# Patient Record
Sex: Male | Born: 1975 | Race: Black or African American | Hispanic: No | Marital: Single | Smoking: Never smoker
Health system: Southern US, Community
[De-identification: ages and names within clinical notes are randomized; demographics above are authoritative.]

## PROBLEM LIST (undated history)

## (undated) DIAGNOSIS — K3533 Acute appendicitis with perforation and localized peritonitis, with abscess: Secondary | ICD-10-CM

## (undated) DIAGNOSIS — E785 Hyperlipidemia, unspecified: Secondary | ICD-10-CM

## (undated) HISTORY — DX: Hyperlipidemia, unspecified: E78.5

## (undated) HISTORY — DX: Acute appendicitis with perforation, localized peritonitis, and gangrene, with abscess: K35.33

## (undated) HISTORY — PX: APPENDECTOMY: SHX54

---

## 1999-07-01 ENCOUNTER — Emergency Department (HOSPITAL_COMMUNITY): Admission: EM | Admit: 1999-07-01 | Discharge: 1999-07-01 | Payer: Self-pay | Admitting: Emergency Medicine

## 1999-07-02 ENCOUNTER — Encounter: Payer: Self-pay | Admitting: *Deleted

## 2002-02-26 ENCOUNTER — Emergency Department (HOSPITAL_COMMUNITY): Admission: EM | Admit: 2002-02-26 | Discharge: 2002-02-26 | Payer: Self-pay

## 2006-06-13 ENCOUNTER — Emergency Department (HOSPITAL_COMMUNITY): Admission: EM | Admit: 2006-06-13 | Discharge: 2006-06-13 | Payer: Self-pay | Admitting: Emergency Medicine

## 2011-08-20 ENCOUNTER — Observation Stay (HOSPITAL_COMMUNITY)
Admission: EM | Admit: 2011-08-20 | Discharge: 2011-08-21 | Disposition: A | Discharge status: Home / Self Care | Payer: Medicare Other | Attending: Emergency Medicine | Admitting: Emergency Medicine

## 2011-08-20 ENCOUNTER — Emergency Department (HOSPITAL_COMMUNITY): Payer: Medicare Other

## 2011-08-20 DIAGNOSIS — R079 Chest pain, unspecified: Principal | ICD-10-CM | POA: Insufficient documentation

## 2011-08-20 LAB — POCT I-STAT, CHEM 8
BUN: 19 mg/dL (ref 6–23)
Calcium, Ion: 1.22 mmol/L (ref 1.12–1.32)
Chloride: 101 mEq/L (ref 96–112)
Creatinine, Ser: 1.4 mg/dL — ABNORMAL HIGH (ref 0.50–1.35)
Glucose, Bld: 141 mg/dL — ABNORMAL HIGH (ref 70–99)
HCT: 40 % (ref 39.0–52.0)
Hemoglobin: 13.6 g/dL (ref 13.0–17.0)
Potassium: 4.2 mEq/L (ref 3.5–5.1)
Sodium: 138 mEq/L (ref 135–145)
TCO2: 27 mmol/L (ref 0–100)

## 2011-08-20 LAB — POCT I-STAT TROPONIN I: Troponin i, poc: 0 ng/mL (ref 0.00–0.08)

## 2011-08-21 ENCOUNTER — Observation Stay (HOSPITAL_COMMUNITY): Payer: Medicare Other

## 2011-08-21 LAB — CK TOTAL AND CKMB (NOT AT ARMC)
CK, MB: 2 ng/mL (ref 0.3–4.0)
CK, MB: 1.7 ng/mL (ref 0.3–4.0)
Relative Index: INVALID (ref 0.0–2.5)
Relative Index: INVALID (ref 0.0–2.5)
Total CK: 82 U/L (ref 7–232)
Total CK: 77 U/L (ref 7–232)

## 2011-08-21 LAB — POCT I-STAT TROPONIN I
Troponin i, poc: 0.01 ng/mL (ref 0.00–0.08)
Troponin i, poc: 0 ng/mL (ref 0.00–0.08)

## 2012-04-29 ENCOUNTER — Other Ambulatory Visit: Payer: Self-pay | Admitting: Cardiology

## 2012-06-02 ENCOUNTER — Emergency Department (HOSPITAL_COMMUNITY): Payer: PRIVATE HEALTH INSURANCE

## 2012-06-02 ENCOUNTER — Inpatient Hospital Stay (HOSPITAL_COMMUNITY)
Admission: EM | Admit: 2012-06-02 | Discharge: 2012-06-06 | DRG: 340 | Disposition: A | Discharge status: Home / Self Care | Payer: PRIVATE HEALTH INSURANCE | Attending: General Surgery | Admitting: General Surgery

## 2012-06-02 ENCOUNTER — Encounter (HOSPITAL_COMMUNITY): Payer: Self-pay | Admitting: Emergency Medicine

## 2012-06-02 DIAGNOSIS — R112 Nausea with vomiting, unspecified: Secondary | ICD-10-CM | POA: Diagnosis present

## 2012-06-02 DIAGNOSIS — K3580 Unspecified acute appendicitis: Secondary | ICD-10-CM

## 2012-06-02 DIAGNOSIS — K3533 Acute appendicitis with perforation and localized peritonitis, with abscess: Principal | ICD-10-CM | POA: Diagnosis present

## 2012-06-02 DIAGNOSIS — E119 Type 2 diabetes mellitus without complications: Secondary | ICD-10-CM | POA: Diagnosis present

## 2012-06-02 DIAGNOSIS — E876 Hypokalemia: Secondary | ICD-10-CM | POA: Diagnosis present

## 2012-06-02 LAB — COMPREHENSIVE METABOLIC PANEL
ALT: 28 U/L (ref 0–53)
AST: 38 U/L — ABNORMAL HIGH (ref 0–37)
Alkaline Phosphatase: 51 U/L (ref 39–117)
CO2: 30 mEq/L (ref 19–32)
Calcium: 9.8 mg/dL (ref 8.4–10.5)
Chloride: 89 mEq/L — ABNORMAL LOW (ref 96–112)
GFR calc Af Amer: 86 mL/min — ABNORMAL LOW (ref >90)
GFR calc non Af Amer: 74 mL/min — ABNORMAL LOW (ref >90)
Glucose, Bld: 123 mg/dL — ABNORMAL HIGH (ref 70–99)
Potassium: 2.6 mEq/L — CL (ref 3.5–5.1)
Sodium: 134 mEq/L — ABNORMAL LOW (ref 135–145)
Total Bilirubin: 1.1 mg/dL (ref 0.3–1.2)

## 2012-06-02 LAB — CBC WITH DIFFERENTIAL/PLATELET
Basophils Absolute: 0 10*3/uL (ref 0.0–0.1)
Lymphocytes Relative: 6 % — ABNORMAL LOW (ref 12–46)
Lymphs Abs: 0.9 10*3/uL (ref 0.7–4.0)
MCV: 76.5 fL — ABNORMAL LOW (ref 78.0–100.0)
Neutro Abs: 12.1 10*3/uL — ABNORMAL HIGH (ref 1.7–7.7)
Platelets: 277 10*3/uL (ref 150–400)
RBC: 4.94 MIL/uL (ref 4.22–5.81)
RDW: 14.7 % (ref 11.5–15.5)
WBC: 13.9 10*3/uL — ABNORMAL HIGH (ref 4.0–10.5)

## 2012-06-02 LAB — URINALYSIS, ROUTINE W REFLEX MICROSCOPIC
Bilirubin Urine: NEGATIVE
Glucose, UA: NEGATIVE mg/dL
Hgb urine dipstick: NEGATIVE
Protein, ur: 30 mg/dL — AB

## 2012-06-02 LAB — GLUCOSE, CAPILLARY

## 2012-06-02 MED ORDER — PANTOPRAZOLE SODIUM 40 MG IV SOLR
40.0000 mg | Freq: Every day | INTRAVENOUS | Status: DC
  Administered 2012-06-03 – 2012-06-04 (×2): 40 mg via INTRAVENOUS
  Filled 2012-06-02 (×3): qty 40

## 2012-06-02 MED ORDER — PIPERACILLIN-TAZOBACTAM 3.375 G IVPB
3.3750 g | Freq: Three times a day (TID) | INTRAVENOUS | Status: DC
  Administered 2012-06-03 – 2012-06-04 (×4): 3.375 g via INTRAVENOUS
  Filled 2012-06-02 (×6): qty 50

## 2012-06-02 MED ORDER — KCL IN DEXTROSE-NACL 30-5-0.45 MEQ/L-%-% IV SOLN
INTRAVENOUS | Status: DC
  Administered 2012-06-02: 125 mL via INTRAVENOUS
  Administered 2012-06-03: 08:00:00 via INTRAVENOUS
  Filled 2012-06-02 (×7): qty 1000

## 2012-06-02 MED ORDER — POTASSIUM CHLORIDE 10 MEQ/100ML IV SOLN
10.0000 meq | INTRAVENOUS | Status: AC
  Administered 2012-06-03 (×3): 10 meq via INTRAVENOUS
  Filled 2012-06-02 (×3): qty 100

## 2012-06-02 MED ORDER — POTASSIUM CHLORIDE 10 MEQ/100ML IV SOLN
10.0000 meq | Freq: Once | INTRAVENOUS | Status: AC
  Administered 2012-06-02: 10 meq via INTRAVENOUS
  Filled 2012-06-02: qty 100

## 2012-06-02 MED ORDER — INSULIN ASPART 100 UNIT/ML ~~LOC~~ SOLN
0.0000 [IU] | SUBCUTANEOUS | Status: DC
  Administered 2012-06-03 – 2012-06-04 (×5): 3 [IU] via SUBCUTANEOUS
  Administered 2012-06-04: 2 [IU] via SUBCUTANEOUS
  Administered 2012-06-04: 3 [IU] via SUBCUTANEOUS
  Administered 2012-06-05 (×2): 5 [IU] via SUBCUTANEOUS
  Administered 2012-06-05: 2 [IU] via SUBCUTANEOUS
  Administered 2012-06-05: 3 [IU] via SUBCUTANEOUS
  Administered 2012-06-06: 2 [IU] via SUBCUTANEOUS
  Administered 2012-06-06 (×2): 3 [IU] via SUBCUTANEOUS

## 2012-06-02 MED ORDER — PIPERACILLIN-TAZOBACTAM 3.375 G IVPB
3.3750 g | Freq: Once | INTRAVENOUS | Status: AC
  Administered 2012-06-02: 3.375 g via INTRAVENOUS
  Filled 2012-06-02: qty 50

## 2012-06-02 MED ORDER — IOHEXOL 300 MG/ML  SOLN
100.0000 mL | Freq: Once | INTRAMUSCULAR | Status: AC | PRN
  Administered 2012-06-02: 100 mL via INTRAVENOUS

## 2012-06-02 MED ORDER — SODIUM CHLORIDE 0.9 % IV BOLUS (SEPSIS)
1000.0000 mL | Freq: Once | INTRAVENOUS | Status: AC
  Administered 2012-06-02: 1000 mL via INTRAVENOUS

## 2012-06-02 MED ORDER — MORPHINE SULFATE 2 MG/ML IJ SOLN
2.0000 mg | INTRAMUSCULAR | Status: DC | PRN
  Administered 2012-06-03 (×2): 4 mg via INTRAVENOUS
  Administered 2012-06-03: 2 mg via INTRAVENOUS
  Filled 2012-06-02: qty 2
  Filled 2012-06-02: qty 1
  Filled 2012-06-02: qty 2
  Filled 2012-06-02: qty 1

## 2012-06-02 MED ORDER — ONDANSETRON HCL 4 MG/2ML IJ SOLN: 4.0000 mg | Freq: Four times a day (QID) | INTRAMUSCULAR | Status: DC | PRN

## 2012-06-02 MED ORDER — KCL IN DEXTROSE-NACL 20-5-0.45 MEQ/L-%-% IV SOLN: INTRAVENOUS | Status: DC

## 2012-06-02 NOTE — ED Notes (Signed)
Pt reports lower abdominal pain since this past Saturday. States that he has had nausea/vomiting and diarrhea with the abdominal pain. Pt denies burning on urination, urinary urgency or retention. Pt appears somewhat slow in response and is a poor historian. Mother at the bedside.

## 2012-06-02 NOTE — ED Notes (Signed)
In and out catheterization performed for a urine sample. Unable to obtain any urine from the patient.

## 2012-06-02 NOTE — ED Notes (Signed)
Pt unable to get out of car and helped to a wheelchair. Pt with DM-2 and thought his blood sugar was low. Pt CBG in triage 128. Pt states "I am just not feeling right".

## 2012-06-02 NOTE — ED Notes (Signed)
Patient in xray.will obtain blood upon patient's return

## 2012-06-02 NOTE — ED Notes (Signed)
MD at bedside. 

## 2012-06-02 NOTE — ED Notes (Signed)
Lab called with critical lab value for potassium 2.6

## 2012-06-02 NOTE — ED Notes (Signed)
Report given to Britton, RN on [address]

## 2012-06-02 NOTE — ED Notes (Signed)
Patient transported to X-ray 

## 2012-06-02 NOTE — ED Notes (Signed)
Pt states that he cannot urinate at this time. Informed EDP of pt's inability to urinate.

## 2012-06-02 NOTE — ED Notes (Signed)
Paul Key (mother) [phone]

## 2012-06-02 NOTE — H&P (Signed)
Paul Key is an 36 y.o. male.   Chief Complaint: Abdominal pain HPI: This is a 35 year old male who developed centralized abdominal pain 05/30/2012. Awakened, the pain persisted and radiated to the lower abdomen. This was associated with subjective fever and chills as well as nausea and vomiting. He's not been able to keep anything down since Friday. He had a small amount of diarrhea. No hematemesis. No blood in the stool. No dysuria or hematuria. He presented to the emergency department and evaluation demonstrated appendicitis with an abscess and I was asked to see him because of this.  Past Medical History  Diagnosis Date  . Diabetes mellitus      Hypertension  Past Surgical History  Procedure Date  . Hernia repair     No family history on file. Social History:  reports that he has never smoked. He does not have any smokeless tobacco history on file. He reports that he does not drink alcohol. His drug history not on file.  Allergies: No Known Allergies   (Not in a hospital admission)  Results for orders placed during the hospital encounter of 06/02/12 (from the past 48 hour(s))  GLUCOSE, CAPILLARY     Status: Abnormal   Collection Time   06/02/12  6:54 PM      Component Value Range Comment   Glucose-Capillary 128 (*) 70 - 99 mg/dL    Comment 1 Notify RN     CBC WITH DIFFERENTIAL     Status: Abnormal   Collection Time   06/02/12  8:10 PM      Component Value Range Comment   WBC 13.9 (*) 4.0 - 10.5 K/uL    RBC 4.94  4.22 - 5.81 MIL/uL    Hemoglobin 13.3  13.0 - 17.0 g/dL    HCT 16.1 (*) 09.6 - 52.0 %    MCV 76.5 (*) 78.0 - 100.0 fL    MCH 26.9  26.0 - 34.0 pg    MCHC 35.2  30.0 - 36.0 g/dL    RDW 04.5  40.9 - 81.1 %    Platelets 277  150 - 400 K/uL    Neutrophils Relative 87 (*) 43 - 77 %    Neutro Abs 12.1 (*) 1.7 - 7.7 K/uL    Lymphocytes Relative 6 (*) 12 - 46 %    Lymphs Abs 0.9  0.7 - 4.0 K/uL    Monocytes Relative 7  3 - 12 %    Monocytes Absolute 0.9  0.1 - 1.0  K/uL    Eosinophils Relative 0  0 - 5 %    Eosinophils Absolute 0.0  0.0 - 0.7 K/uL    Basophils Relative 0  0 - 1 %    Basophils Absolute 0.0  0.0 - 0.1 K/uL   COMPREHENSIVE METABOLIC PANEL     Status: Abnormal   Collection Time   06/02/12  8:10 PM      Component Value Range Comment   Sodium 134 (*) 135 - 145 mEq/L    Potassium 2.6 (*) 3.5 - 5.1 mEq/L    Chloride 89 (*) 96 - 112 mEq/L    CO2 30  19 - 32 mEq/L    Glucose, Bld 123 (*) 70 - 99 mg/dL    BUN 9  6 - 23 mg/dL    Creatinine, Ser 9.14  0.50 - 1.35 mg/dL    Calcium 9.8  8.4 - 78.2 mg/dL    Total Protein 8.1  6.0 - 8.3 g/dL    Albumin  4.1  3.5 - 5.2 g/dL    AST 38 (*) 0 - 37 U/L    ALT 28  0 - 53 U/L    Alkaline Phosphatase 51  39 - 117 U/L    Total Bilirubin 1.1  0.3 - 1.2 mg/dL    GFR calc non Af Amer 74 (*) >90 mL/min    GFR calc Af Amer 86 (*) >90 mL/min   LIPASE, BLOOD     Status: Normal   Collection Time   06/02/12  8:10 PM      Component Value Range Comment   Lipase 27  11 - 59 U/L    Ct Abdomen Pelvis W Contrast  06/02/2012  *RADIOLOGY REPORT*  Clinical Data: Lower abdominal pain since Saturday.  Nausea, vomiting, and diarrhea.  Burning with urination.  CT ABDOMEN AND PELVIS WITH CONTRAST  Technique:  Multidetector CT imaging of the abdomen and pelvis was performed following the standard protocol during bolus administration of intravenous contrast.  Contrast: OMNIPAQUE IOHEXOL 300 MG/ML  SOLN  Comparison: None.  Findings: Mild dependent atelectasis in the lung bases.  The liver, spleen, gallbladder, adrenal glands, pancreas, abdominal aorta, and retroperitoneal lymph nodes are unremarkable.  Sub centimeter parenchymal cysts in the kidneys.  No solid mass or hydronephrosis.  The stomach and small bowel are not distended. Stool filled colon without distension.  No free air or free fluid in the abdomen.  In the lower abdominal mesentery, there is an inflammatory process present with infiltration in the mesenteric fat  surrounding the large appendix containing an appendicolith.  The proximal portion of the appendix measures about 11 cm diameter.  The appendiceal tip is distended to about 2.1 cm diameter and contains an air-fluid levels suggesting appendiceal/periappendiceal abscess.  Changes are consistent with acute appendicitis.  Pelvis:  There is a small amount of free fluid in the pelvis, likely representing reactive inflammation.  The prostate gland is not enlarged.  Mild bladder wall thickening which might represent reactive inflammation as the appendix is located over the dome of the bladder.  No significant pelvic lymphadenopathy.  No evidence of diverticulitis. There is normal alignment of the lumbar vertebrae.  IMPRESSION: Changes of acute appendicitis with periappendiceal infiltration and 2.1 cm diameter abscess at the appendiceal tip.  Appendicolith is present.  Bladder wall thickening may be due to reactive inflammation as the appendix extends across the dome of the bladder.  Original Report Authenticated By: Marlon Pel, M.D.   Dg Abd Acute W/chest  06/02/2012  *RADIOLOGY REPORT*  Clinical Data: Abdominal distension  ACUTE ABDOMEN SERIES (ABDOMEN 2 VIEW & CHEST 1 VIEW)  Comparison: Chest radiograph - 08/20/2011  Findings:  Grossly unchanged cardiac silhouette and mediastinal contours. Lung volumes remain persistently reduced.  Improved aeration of the right mid lung.  No focal airspace opacities.  No definite pleural effusion or pneumothorax.  There is an overall paucity of bowel gas without definite evidence of obstruction.  No pneumoperitoneum, pneumatosis or portal venous gas. No definite abnormal intra-abdominal calcifications.  No acute osseous abnormalities.  IMPRESSION: 1.  Decreased lung volumes without acute cardiopulmonary disease. 2.  Paucity of bowel gas without definite evidence of obstruction.  Original Report Authenticated By: Waynard Reeds, M.D.    Review of Systems  Constitutional:  Positive for fever, chills, weight loss and malaise/fatigue.  Respiratory: Negative for cough.   Cardiovascular: Negative for chest pain.  Gastrointestinal: Positive for heartburn, nausea, vomiting, abdominal pain and diarrhea. Negative for blood in stool.  Genitourinary: Negative for dysuria and hematuria.  Neurological: Positive for headaches.  Endo/Heme/Allergies:       Sickle cell trait.    Blood pressure 98/62, pulse 74, temperature 99.1 F (37.3 C), resp. rate 18, SpO2 95.00%. Physical Exam  Constitutional:       Ill appearing male, but in NAD.  HENT:  Head: Normocephalic and atraumatic.       Wear glasses.  Eyes: No scleral icterus.  Neck: Neck supple.  Cardiovascular: Normal rate and regular rhythm.   Respiratory: Effort normal and breath sounds normal.  GI: Soft. He exhibits no distension and no mass. There is tenderness (RLQ and lower midline area ). There is guarding.  Musculoskeletal: He exhibits no edema.  Lymphadenopathy:    He has no cervical adenopathy.  Skin: Skin is dry.     Assessment/Plan 1. Acute appendicitis with abscess   2. Dehydration  3. Hypokalemia  Plan: Admit to the hospital. Start broad-spectrum IV antibiotics. IV fluid hydration and potassium replacement. Consult interventional radiology for percutaneous drainage of abscess.  If successful, will treat with antibiotics followed by interval appendectomy.. If this is unsuccessful or unable to be done he may need open appendectomy with abscess drainage. Both he and his mother understand the plan and agree.  Jersey Espinoza J 06/02/2012, 11:06 PM

## 2012-06-02 NOTE — ED Notes (Signed)
Patient aware of need for urine specimen. Patient unable to void at this time. Patient given urinal. Encouraged to call for assistance if needed.   

## 2012-06-02 NOTE — ED Notes (Signed)
Dr. Abbey Chatters at the bedside.

## 2012-06-02 NOTE — ED Notes (Signed)
Patient transported to CT 

## 2012-06-02 NOTE — ED Provider Notes (Signed)
History     CSN: [id number]  Arrival date & time 06/02/12  1854   First MD Initiated Contact with Patient 06/02/12 1928      Chief Complaint  Patient presents with  . Abdominal Pain    (Consider location/radiation/quality/duration/timing/severity/associated sxs/prior treatment) HPI Pt presents with vague complaints of abdominal pain and nausea since Friday. Pt passed small amount of liquid stool today. No fever/chills. No urinary complaints. +fatigue. Abd pain is mostly located in lower quadrants.  Past Medical History  Diagnosis Date  . Diabetes mellitus     Past Surgical History  Procedure Date  . Hernia repair     No family history on file.  History  Substance Use Topics  . Smoking status: Never Smoker   . Smokeless tobacco: Not on file  . Alcohol Use: No      Review of Systems  Constitutional: Positive for fatigue. Negative for fever and chills.  HENT: Negative for sore throat.   Respiratory: Negative for cough and shortness of breath.   Cardiovascular: Negative for chest pain and leg swelling.  Gastrointestinal: Positive for nausea and abdominal distention. Negative for vomiting and constipation.  Genitourinary: Negative for dysuria and frequency.  Neurological: Negative for dizziness, light-headedness, numbness and headaches.    Allergies  Review of patient's allergies indicates no known allergies.  Home Medications   Current Outpatient Rx  Name Route Sig Dispense Refill  . METFORMIN HCL 500 MG PO TABS Oral Take 500 mg by mouth daily with breakfast.    . PRAVASTATIN SODIUM 40 MG PO TABS Oral Take 40 mg by mouth daily.      BP 98/62  Pulse 74  Temp 99.1 F (37.3 C)  Resp 18  SpO2 95%  Physical Exam  Nursing note and vitals reviewed. Constitutional: He is oriented to person, place, and time. He appears well-developed and well-nourished. No distress.  HENT:  Head: Normocephalic and atraumatic.  Mouth/Throat: Oropharynx is clear and moist.    Eyes: EOM are normal. Pupils are equal, round, and reactive to light.  Neck: Normal range of motion. Neck supple.  Cardiovascular: Normal rate and regular rhythm.   Pulmonary/Chest: Effort normal and breath sounds normal. No respiratory distress. He has no wheezes. He has no rales.  Abdominal: Soft. Bowel sounds are normal. He exhibits distension. There is tenderness (Bl lower abd TTP, R>L). There is no rebound and no guarding.  Musculoskeletal: Normal range of motion. He exhibits no edema and no tenderness.  Neurological: He is alert and oriented to person, place, and time.       5/5 motor, sensation intact  Skin: Skin is warm and dry. No rash noted. No erythema.  Psychiatric: He has a normal mood and affect. His behavior is normal.    ED Course  Procedures (including critical care time)  Labs Reviewed  GLUCOSE, CAPILLARY - Abnormal; Notable for the following:    Glucose-Capillary 128 (*)     All other components within normal limits  CBC WITH DIFFERENTIAL - Abnormal; Notable for the following:    WBC 13.9 (*)     HCT 37.8 (*)     MCV 76.5 (*)     Neutrophils Relative 87 (*)     Neutro Abs 12.1 (*)     Lymphocytes Relative 6 (*)     All other components within normal limits  COMPREHENSIVE METABOLIC PANEL - Abnormal; Notable for the following:    Sodium 134 (*)     Potassium 2.6 (*)  Chloride 89 (*)     Glucose, Bld 123 (*)     AST 38 (*)     GFR calc non Af Amer 74 (*)     GFR calc Af Amer 86 (*)     All other components within normal limits  LIPASE, BLOOD  URINALYSIS, ROUTINE W REFLEX MICROSCOPIC  BASIC METABOLIC PANEL  CBC   Ct Abdomen Pelvis W Contrast  06/02/2012  *RADIOLOGY REPORT*  Clinical Data: Lower abdominal pain since Saturday.  Nausea, vomiting, and diarrhea.  Burning with urination.  CT ABDOMEN AND PELVIS WITH CONTRAST  Technique:  Multidetector CT imaging of the abdomen and pelvis was performed following the standard protocol during bolus administration  of intravenous contrast.  Contrast: OMNIPAQUE IOHEXOL 300 MG/ML  SOLN  Comparison: None.  Findings: Mild dependent atelectasis in the lung bases.  The liver, spleen, gallbladder, adrenal glands, pancreas, abdominal aorta, and retroperitoneal lymph nodes are unremarkable.  Sub centimeter parenchymal cysts in the kidneys.  No solid mass or hydronephrosis.  The stomach and small bowel are not distended. Stool filled colon without distension.  No free air or free fluid in the abdomen.  In the lower abdominal mesentery, there is an inflammatory process present with infiltration in the mesenteric fat surrounding the large appendix containing an appendicolith.  The proximal portion of the appendix measures about 11 cm diameter.  The appendiceal tip is distended to about 2.1 cm diameter and contains an air-fluid levels suggesting appendiceal/periappendiceal abscess.  Changes are consistent with acute appendicitis.  Pelvis:  There is a small amount of free fluid in the pelvis, likely representing reactive inflammation.  The prostate gland is not enlarged.  Mild bladder wall thickening which might represent reactive inflammation as the appendix is located over the dome of the bladder.  No significant pelvic lymphadenopathy.  No evidence of diverticulitis. There is normal alignment of the lumbar vertebrae.  IMPRESSION: Changes of acute appendicitis with periappendiceal infiltration and 2.1 cm diameter abscess at the appendiceal tip.  Appendicolith is present.  Bladder wall thickening may be due to reactive inflammation as the appendix extends across the dome of the bladder.  Original Report Authenticated By: Marlon Pel, M.D.   Dg Abd Acute W/chest  06/02/2012  *RADIOLOGY REPORT*  Clinical Data: Abdominal distension  ACUTE ABDOMEN SERIES (ABDOMEN 2 VIEW & CHEST 1 VIEW)  Comparison: Chest radiograph - 08/20/2011  Findings:  Grossly unchanged cardiac silhouette and mediastinal contours. Lung volumes remain  persistently reduced.  Improved aeration of the right mid lung.  No focal airspace opacities.  No definite pleural effusion or pneumothorax.  There is an overall paucity of bowel gas without definite evidence of obstruction.  No pneumoperitoneum, pneumatosis or portal venous gas. No definite abnormal intra-abdominal calcifications.  No acute osseous abnormalities.  IMPRESSION: 1.  Decreased lung volumes without acute cardiopulmonary disease. 2.  Paucity of bowel gas without definite evidence of obstruction.  Original Report Authenticated By: Waynard Reeds, M.D.     1. Appendicitis with abscess   2. Hypokalemia       MDM  Discussed with gen surg. Will see in ED. Advise Zosyn IV      Loren Racer, MD 06/02/12 [phone]

## 2012-06-03 ENCOUNTER — Inpatient Hospital Stay (HOSPITAL_COMMUNITY): Payer: PRIVATE HEALTH INSURANCE

## 2012-06-03 ENCOUNTER — Encounter (HOSPITAL_COMMUNITY): Payer: Self-pay | Admitting: *Deleted

## 2012-06-03 LAB — GLUCOSE, CAPILLARY
Glucose-Capillary: 176 mg/dL — ABNORMAL HIGH (ref 70–99)
Glucose-Capillary: 120 mg/dL — ABNORMAL HIGH (ref 70–99)
Glucose-Capillary: 105 mg/dL — ABNORMAL HIGH (ref 70–99)

## 2012-06-03 LAB — BASIC METABOLIC PANEL
BUN: 9 mg/dL (ref 6–23)
CO2: 29 mEq/L (ref 19–32)
CO2: 30 mEq/L (ref 19–32)
Calcium: 9.1 mg/dL (ref 8.4–10.5)
Chloride: 97 mEq/L (ref 96–112)
Creatinine, Ser: 1.31 mg/dL (ref 0.50–1.35)
GFR calc Af Amer: 79 mL/min — ABNORMAL LOW (ref >90)
Glucose, Bld: 165 mg/dL — ABNORMAL HIGH (ref 70–99)
Potassium: 3.1 mEq/L — ABNORMAL LOW (ref 3.5–5.1)
Sodium: 135 mEq/L (ref 135–145)

## 2012-06-03 LAB — CBC
HCT: 33.7 % — ABNORMAL LOW (ref 39.0–52.0)
Hemoglobin: 11.7 g/dL — ABNORMAL LOW (ref 13.0–17.0)
MCH: 26.5 pg (ref 26.0–34.0)
MCV: 76.2 fL — ABNORMAL LOW (ref 78.0–100.0)
RBC: 4.42 MIL/uL (ref 4.22–5.81)

## 2012-06-03 LAB — MAGNESIUM: Magnesium: 1.2 mg/dL — ABNORMAL LOW (ref 1.5–2.5)

## 2012-06-03 LAB — PROTIME-INR: Prothrombin Time: 15.2 seconds (ref 11.6–15.2)

## 2012-06-03 MED ORDER — FENTANYL CITRATE 0.05 MG/ML IJ SOLN
INTRAMUSCULAR | Status: AC | PRN
  Administered 2012-06-03: 100 ug via INTRAVENOUS

## 2012-06-03 MED ORDER — FENTANYL CITRATE 0.05 MG/ML IJ SOLN
INTRAMUSCULAR | Status: AC
  Filled 2012-06-03: qty 4

## 2012-06-03 MED ORDER — MIDAZOLAM HCL 2 MG/2ML IJ SOLN
INTRAMUSCULAR | Status: AC
  Filled 2012-06-03: qty 2

## 2012-06-03 MED ORDER — ACETAMINOPHEN 325 MG PO TABS
650.0000 mg | ORAL_TABLET | Freq: Once | ORAL | Status: AC
  Administered 2012-06-03: 650 mg via ORAL
  Filled 2012-06-03: qty 2

## 2012-06-03 MED ORDER — BIOTENE DRY MOUTH MT LIQD
15.0000 mL | Freq: Two times a day (BID) | OROMUCOSAL | Status: DC
  Administered 2012-06-03 – 2012-06-06 (×6): 15 mL via OROMUCOSAL

## 2012-06-03 MED ORDER — MIDAZOLAM HCL 5 MG/5ML IJ SOLN
INTRAMUSCULAR | Status: AC | PRN
  Administered 2012-06-03 (×2): 2 mg via INTRAVENOUS

## 2012-06-03 MED ORDER — ACETAMINOPHEN 10 MG/ML IV SOLN
1000.0000 mg | Freq: Four times a day (QID) | INTRAVENOUS | Status: AC
  Administered 2012-06-03 – 2012-06-04 (×4): 1000 mg via INTRAVENOUS
  Filled 2012-06-03 (×5): qty 100

## 2012-06-03 MED ORDER — FENTANYL CITRATE 0.05 MG/ML IJ SOLN
INTRAMUSCULAR | Status: AC
  Filled 2012-06-03: qty 2

## 2012-06-03 MED ORDER — CHLORHEXIDINE GLUCONATE 0.12 % MT SOLN
15.0000 mL | Freq: Two times a day (BID) | OROMUCOSAL | Status: DC
  Administered 2012-06-03 – 2012-06-06 (×4): 15 mL via OROMUCOSAL
  Filled 2012-06-03 (×9): qty 15

## 2012-06-03 MED ORDER — MIDAZOLAM HCL 2 MG/2ML IJ SOLN
INTRAMUSCULAR | Status: AC
  Filled 2012-06-03: qty 4

## 2012-06-03 MED ORDER — POTASSIUM CHLORIDE 10 MEQ/100ML IV SOLN
10.0000 meq | INTRAVENOUS | Status: AC
  Administered 2012-06-03 (×4): 10 meq via INTRAVENOUS
  Filled 2012-06-03 (×4): qty 100

## 2012-06-03 NOTE — H&P (Signed)
Paul Key is an 36 y.o. male.   Chief Complaint: N/V/abd pain; +fever CT shows appendicitis and abscess; request per [facility] for drain Scheduled now for abscess drain placement HPI: DM  Past Medical History  Diagnosis Date  . Diabetes mellitus     Past Surgical History  Procedure Date  . Hernia repair     History reviewed. No pertinent family history. Social History:  reports that he has never smoked. He does not have any smokeless tobacco history on file. He reports that he does not drink alcohol. His drug history not on file.  Allergies: No Known Allergies  Medications Prior to Admission  Medication Sig Dispense Refill  . metFORMIN (GLUCOPHAGE) 500 MG tablet Take 500 mg by mouth daily with breakfast.      . pravastatin (PRAVACHOL) 40 MG tablet Take 40 mg by mouth daily.        Results for orders placed during the hospital encounter of 06/02/12 (from the past 48 hour(s))  GLUCOSE, CAPILLARY     Status: Abnormal   Collection Time   06/02/12  6:54 PM      Component Value Range Comment   Glucose-Capillary 128 (*) 70 - 99 mg/dL    Comment 1 Notify RN     CBC WITH DIFFERENTIAL     Status: Abnormal   Collection Time   06/02/12  8:10 PM      Component Value Range Comment   WBC 13.9 (*) 4.0 - 10.5 K/uL    RBC 4.94  4.22 - 5.81 MIL/uL    Hemoglobin 13.3  13.0 - 17.0 g/dL    HCT 16.1 (*) 09.6 - 52.0 %    MCV 76.5 (*) 78.0 - 100.0 fL    MCH 26.9  26.0 - 34.0 pg    MCHC 35.2  30.0 - 36.0 g/dL    RDW 04.5  40.9 - 81.1 %    Platelets 277  150 - 400 K/uL    Neutrophils Relative 87 (*) 43 - 77 %    Neutro Abs 12.1 (*) 1.7 - 7.7 K/uL    Lymphocytes Relative 6 (*) 12 - 46 %    Lymphs Abs 0.9  0.7 - 4.0 K/uL    Monocytes Relative 7  3 - 12 %    Monocytes Absolute 0.9  0.1 - 1.0 K/uL    Eosinophils Relative 0  0 - 5 %    Eosinophils Absolute 0.0  0.0 - 0.7 K/uL    Basophils Relative 0  0 - 1 %    Basophils Absolute 0.0  0.0 - 0.1 K/uL   COMPREHENSIVE METABOLIC PANEL     Status:  Abnormal   Collection Time   06/02/12  8:10 PM      Component Value Range Comment   Sodium 134 (*) 135 - 145 mEq/L    Potassium 2.6 (*) 3.5 - 5.1 mEq/L    Chloride 89 (*) 96 - 112 mEq/L    CO2 30  19 - 32 mEq/L    Glucose, Bld 123 (*) 70 - 99 mg/dL    BUN 9  6 - 23 mg/dL    Creatinine, Ser 9.14  0.50 - 1.35 mg/dL    Calcium 9.8  8.4 - 78.2 mg/dL    Total Protein 8.1  6.0 - 8.3 g/dL    Albumin 4.1  3.5 - 5.2 g/dL    AST 38 (*) 0 - 37 U/L    ALT 28  0 - 53 U/L  Alkaline Phosphatase 51  39 - 117 U/L    Total Bilirubin 1.1  0.3 - 1.2 mg/dL    GFR calc non Af Amer 74 (*) >90 mL/min    GFR calc Af Amer 86 (*) >90 mL/min   LIPASE, BLOOD     Status: Normal   Collection Time   06/02/12  8:10 PM      Component Value Range Comment   Lipase 27  11 - 59 U/L   URINALYSIS, ROUTINE W REFLEX MICROSCOPIC     Status: Abnormal   Collection Time   06/02/12 11:08 PM      Component Value Range Comment   Color, Urine YELLOW  YELLOW    APPearance CLEAR  CLEAR    Specific Gravity, Urine 1.042 (*) 1.005 - 1.030    pH 5.5  5.0 - 8.0    Glucose, UA NEGATIVE  NEGATIVE mg/dL    Hgb urine dipstick NEGATIVE  NEGATIVE    Bilirubin Urine NEGATIVE  NEGATIVE    Ketones, ur 15 (*) NEGATIVE mg/dL    Protein, ur 30 (*) NEGATIVE mg/dL    Urobilinogen, UA 2.0 (*) 0.0 - 1.0 mg/dL    Nitrite NEGATIVE  NEGATIVE    Leukocytes, UA NEGATIVE  NEGATIVE   URINE MICROSCOPIC-ADD ON     Status: Normal   Collection Time   06/02/12 11:08 PM      Component Value Range Comment   WBC, UA 3-6  <3 WBC/hpf    Urine-Other MUCOUS PRESENT     GLUCOSE, CAPILLARY     Status: Abnormal   Collection Time   06/03/12 12:48 AM      Component Value Range Comment   Glucose-Capillary 156 (*) 70 - 99 mg/dL   BASIC METABOLIC PANEL     Status: Abnormal   Collection Time   06/03/12  3:55 AM      Component Value Range Comment   Sodium 135  135 - 145 mEq/L    Potassium 3.1 (*) 3.5 - 5.1 mEq/L    Chloride 95 (*) 96 - 112 mEq/L    CO2 29  19 -  32 mEq/L    Glucose, Bld 165 (*) 70 - 99 mg/dL    BUN 9  6 - 23 mg/dL    Creatinine, Ser 1.61  0.50 - 1.35 mg/dL    Calcium 8.6  8.4 - 09.6 mg/dL    GFR calc non Af Amer 66 (*) >90 mL/min    GFR calc Af Amer 77 (*) >90 mL/min   CBC     Status: Abnormal   Collection Time   06/03/12  3:55 AM      Component Value Range Comment   WBC 13.1 (*) 4.0 - 10.5 K/uL    RBC 4.42  4.22 - 5.81 MIL/uL    Hemoglobin 11.7 (*) 13.0 - 17.0 g/dL    HCT 04.5 (*) 40.9 - 52.0 %    MCV 76.2 (*) 78.0 - 100.0 fL    MCH 26.5  26.0 - 34.0 pg    MCHC 34.7  30.0 - 36.0 g/dL    RDW 81.1  91.4 - 78.2 %    Platelets 239  150 - 400 K/uL   GLUCOSE, CAPILLARY     Status: Abnormal   Collection Time   06/03/12  4:09 AM      Component Value Range Comment   Glucose-Capillary 176 (*) 70 - 99 mg/dL   GLUCOSE, CAPILLARY     Status: Abnormal   Collection  Time   06/03/12  7:53 AM      Component Value Range Comment   Glucose-Capillary 120 (*) 70 - 99 mg/dL    Ct Abdomen Pelvis W Contrast  06/02/2012  *RADIOLOGY REPORT*  Clinical Data: Lower abdominal pain since Saturday.  Nausea, vomiting, and diarrhea.  Burning with urination.  CT ABDOMEN AND PELVIS WITH CONTRAST  Technique:  Multidetector CT imaging of the abdomen and pelvis was performed following the standard protocol during bolus administration of intravenous contrast.  Contrast: OMNIPAQUE IOHEXOL 300 MG/ML  SOLN  Comparison: None.  Findings: Mild dependent atelectasis in the lung bases.  The liver, spleen, gallbladder, adrenal glands, pancreas, abdominal aorta, and retroperitoneal lymph nodes are unremarkable.  Sub centimeter parenchymal cysts in the kidneys.  No solid mass or hydronephrosis.  The stomach and small bowel are not distended. Stool filled colon without distension.  No free air or free fluid in the abdomen.  In the lower abdominal mesentery, there is an inflammatory process present with infiltration in the mesenteric fat surrounding the large appendix  containing an appendicolith.  The proximal portion of the appendix measures about 11 cm diameter.  The appendiceal tip is distended to about 2.1 cm diameter and contains an air-fluid levels suggesting appendiceal/periappendiceal abscess.  Changes are consistent with acute appendicitis.  Pelvis:  There is a small amount of free fluid in the pelvis, likely representing reactive inflammation.  The prostate gland is not enlarged.  Mild bladder wall thickening which might represent reactive inflammation as the appendix is located over the dome of the bladder.  No significant pelvic lymphadenopathy.  No evidence of diverticulitis. There is normal alignment of the lumbar vertebrae.  IMPRESSION: Changes of acute appendicitis with periappendiceal infiltration and 2.1 cm diameter abscess at the appendiceal tip.  Appendicolith is present.  Bladder wall thickening may be due to reactive inflammation as the appendix extends across the dome of the bladder.  Original Report Authenticated By: Marlon Pel, M.D.   Dg Abd Acute W/chest  06/02/2012  *RADIOLOGY REPORT*  Clinical Data: Abdominal distension  ACUTE ABDOMEN SERIES (ABDOMEN 2 VIEW & CHEST 1 VIEW)  Comparison: Chest radiograph - 08/20/2011  Findings:  Grossly unchanged cardiac silhouette and mediastinal contours. Lung volumes remain persistently reduced.  Improved aeration of the right mid lung.  No focal airspace opacities.  No definite pleural effusion or pneumothorax.  There is an overall paucity of bowel gas without definite evidence of obstruction.  No pneumoperitoneum, pneumatosis or portal venous gas. No definite abnormal intra-abdominal calcifications.  No acute osseous abnormalities.  IMPRESSION: 1.  Decreased lung volumes without acute cardiopulmonary disease. 2.  Paucity of bowel gas without definite evidence of obstruction.  Original Report Authenticated By: Waynard Reeds, M.D.    Review of Systems  Constitutional: Positive for fever and chills.    Respiratory: Negative for shortness of breath.   Cardiovascular: Negative for chest pain.  Gastrointestinal: Positive for nausea, vomiting and abdominal pain.    Blood pressure 108/65, pulse 88, temperature 99.2 F (37.3 C), temperature source Oral, resp. rate 20, height 5\' 6"  (1.676 m), weight 178 lb 2.1 oz (80.8 kg), SpO2 98.00%. Physical Exam  Constitutional: He is oriented to person, place, and time. He appears well-developed and well-nourished.  Cardiovascular: Normal rate, regular rhythm and normal heart sounds.   No murmur heard. Respiratory: Effort normal. He has no wheezes.  GI: Soft. Bowel sounds are normal. There is tenderness.  Musculoskeletal: Normal range of motion.  Neurological: He  is alert and oriented to person, place, and time.  Skin: Skin is warm and dry.  Psychiatric: He has a normal mood and affect. His behavior is normal. Judgment and thought content normal.     Assessment/Plan Abd pain/ N/V/+fever CT shows abd abscess and now scheduled for drain Pt aware of procedure benefits and risks and agreeable to proceed Consent in chart  Rama Sorci A 06/03/2012, 10:34 AM

## 2012-06-03 NOTE — Progress Notes (Signed)
Subjective: Says he feels better, but still feels like he has a fever, a little diaphoretic also.  Objective: Vital signs in last 24 hours: Temp:  [99.1 F (37.3 C)-101.8 F (38.8 C)] 99.2 F (37.3 C) (07/16 0818) Pulse Rate:  [71-88] 88  (07/16 0601) Resp:  [18-20] 20  (07/16 0601) BP: (98-108)/(60-65) 108/65 mmHg (07/16 0601) SpO2:  [95 %-100 %] 98 % (07/16 0601) Weight:  [80.8 kg (178 lb 2.1 oz)] 80.8 kg (178 lb 2.1 oz) (07/15 2344) Last BM Date: 06/02/12  Temp up to 101.8 6AM.  VSS,K+ up to 3.1  Intake/Output from previous day: 07/15 0701 - 07/16 0700 In: 785.4 [I.V.:785.4] Out: -  Intake/Output this shift:    General appearance: alert, cooperative, no distress and he feels febrile now. GI: soft, tender RLQ, no peritonitis.  few BS.  Lab Results:   Basename 06/03/12 0355 06/02/12 2010  WBC 13.1* 13.9*  HGB 11.7* 13.3  HCT 33.7* 37.8*  PLT 239 277    BMET  Basename 06/03/12 0355 06/02/12 2010  NA 135 134*  K 3.1* 2.6*  CL 95* 89*  CO2 29 30  GLUCOSE 165* 123*  BUN 9 9  CREATININE 1.34 1.22  CALCIUM 8.6 9.8   PT/INR No results found for this basename: LABPROT:2,INR:2 in the last 72 hours   Lab 06/02/12 2010  AST 38*  ALT 28  ALKPHOS 51  BILITOT 1.1  PROT 8.1  ALBUMIN 4.1     Lipase     Component Value Date/Time   LIPASE 27 06/02/2012 2010     Studies/Results: Ct Abdomen Pelvis W Contrast  06/02/2012  *RADIOLOGY REPORT*  Clinical Data: Lower abdominal pain since Saturday.  Nausea, vomiting, and diarrhea.  Burning with urination.  CT ABDOMEN AND PELVIS WITH CONTRAST  Technique:  Multidetector CT imaging of the abdomen and pelvis was performed following the standard protocol during bolus administration of intravenous contrast.  Contrast: OMNIPAQUE IOHEXOL 300 MG/ML  SOLN  Comparison: None.  Findings: Mild dependent atelectasis in the lung bases.  The liver, spleen, gallbladder, adrenal glands, pancreas, abdominal aorta, and retroperitoneal  lymph nodes are unremarkable.  Sub centimeter parenchymal cysts in the kidneys.  No solid mass or hydronephrosis.  The stomach and small bowel are not distended. Stool filled colon without distension.  No free air or free fluid in the abdomen.  In the lower abdominal mesentery, there is an inflammatory process present with infiltration in the mesenteric fat surrounding the large appendix containing an appendicolith.  The proximal portion of the appendix measures about 11 cm diameter.  The appendiceal tip is distended to about 2.1 cm diameter and contains an air-fluid levels suggesting appendiceal/periappendiceal abscess.  Changes are consistent with acute appendicitis.  Pelvis:  There is a small amount of free fluid in the pelvis, likely representing reactive inflammation.  The prostate gland is not enlarged.  Mild bladder wall thickening which might represent reactive inflammation as the appendix is located over the dome of the bladder.  No significant pelvic lymphadenopathy.  No evidence of diverticulitis. There is normal alignment of the lumbar vertebrae.  IMPRESSION: Changes of acute appendicitis with periappendiceal infiltration and 2.1 cm diameter abscess at the appendiceal tip.  Appendicolith is present.  Bladder wall thickening may be due to reactive inflammation as the appendix extends across the dome of the bladder.  Original Report Authenticated By: Marlon Pel, M.D.   Dg Abd Acute W/chest  06/02/2012  *RADIOLOGY REPORT*  Clinical Data: Abdominal distension  ACUTE ABDOMEN SERIES (ABDOMEN 2 VIEW & CHEST 1 VIEW)  Comparison: Chest radiograph - 08/20/2011  Findings:  Grossly unchanged cardiac silhouette and mediastinal contours. Lung volumes remain persistently reduced.  Improved aeration of the right mid lung.  No focal airspace opacities.  No definite pleural effusion or pneumothorax.  There is an overall paucity of bowel gas without definite evidence of obstruction.  No pneumoperitoneum,  pneumatosis or portal venous gas. No definite abnormal intra-abdominal calcifications.  No acute osseous abnormalities.  IMPRESSION: 1.  Decreased lung volumes without acute cardiopulmonary disease. 2.  Paucity of bowel gas without definite evidence of obstruction.  Original Report Authenticated By: Waynard Reeds, M.D.    Medications:    . acetaminophen  650 mg Oral Once  . antiseptic oral rinse  15 mL Mouth Rinse q12n4p  . chlorhexidine  15 mL Mouth Rinse BID  . insulin aspart  0-15 Units Subcutaneous Q4H  . pantoprazole (PROTONIX) IV  40 mg Intravenous QHS  . piperacillin-tazobactam (ZOSYN)  IV  3.375 g Intravenous Once  . piperacillin-tazobactam (ZOSYN)  IV  3.375 g Intravenous Q8H  . potassium chloride  10 mEq Intravenous Once  . potassium chloride  10 mEq Intravenous Q1 Hr x 4  . sodium chloride  1,000 mL Intravenous Once    Assessment/Plan Acute appendicitis with abscess  Dehydration Hypokalemia  Plan  Replace K+, IR to drain abscess, antibiotics, recheck labs later today.    LOS: 1 day    Paul Key,Paul Key 06/03/2012  Percutaneous drain placed and is draining dark green material.  Will begin clear liquids

## 2012-06-03 NOTE — Care Management Note (Addendum)
    Page 1 of 1   06/06/2012     12:51:53 PM   CARE MANAGEMENT NOTE 06/06/2012  Patient:  Paul Key, Paul Key   Account Number:  [id number]  Date Initiated:  06/03/2012  Documentation initiated by:  Lorenda Ishihara  Subjective/Objective Assessment:   36 yo male admitted with perforated appendicitis, perc drain placed in IR. PTA lived at home with sister     Action/Plan:   Follow for d/c needs   Anticipated DC Date:  06/06/2012   Anticipated DC Plan:  HOME W HOME HEALTH SERVICES      DC Planning Services  CM consult      [facility] Choice  HOME HEALTH   Choice offered to / List presented to:  C-1 Patient        HH arranged  HH-1 RN      [facility] agency  [facility]   Status of service:  Completed, signed off Medicare Important Message given?   (If response is "NO", the following Medicare IM given date fields will be blank) Date Medicare IM given:   Date Additional Medicare IM given:    Discharge Disposition:  HOME W HOME HEALTH SERVICES  Per UR Regulation:  Reviewed for med. necessity/level of care/duration of stay  If discussed at Long Length of Stay Meetings, dates discussed:    Comments:  06-06-12 Lorenda Ishihara RN CM [id number] Recieved a referral for [facility] RN to assist with drain care, patient to be d/ced home today and will require irrigations for drain. Contacted patient, he has no preference for [facility] agencies, agrees to use [facility]. Contacted Darl Pikes with [facility] to arrange.

## 2012-06-03 NOTE — Progress Notes (Signed)
Patient has 101.8 l temperature ,DR Rosenbower notified and order received for tylenol 650 mg po x  with sip of water.

## 2012-06-04 LAB — BASIC METABOLIC PANEL WITH GFR
BUN: 8 mg/dL (ref 6–23)
CO2: 28 meq/L (ref 19–32)
Calcium: 8.5 mg/dL (ref 8.4–10.5)
Chloride: 98 meq/L (ref 96–112)
Creatinine, Ser: 1.23 mg/dL (ref 0.50–1.35)
GFR calc Af Amer: 85 mL/min — ABNORMAL LOW
GFR calc non Af Amer: 74 mL/min — ABNORMAL LOW
Glucose, Bld: 193 mg/dL — ABNORMAL HIGH (ref 70–99)
Potassium: 3.3 meq/L — ABNORMAL LOW (ref 3.5–5.1)
Sodium: 134 meq/L — ABNORMAL LOW (ref 135–145)

## 2012-06-04 LAB — CBC
HCT: 32 % — ABNORMAL LOW (ref 39.0–52.0)
Hemoglobin: 10.8 g/dL — ABNORMAL LOW (ref 13.0–17.0)
MCH: 26.2 pg (ref 26.0–34.0)
MCHC: 33.8 g/dL (ref 30.0–36.0)
MCV: 77.7 fL — ABNORMAL LOW (ref 78.0–100.0)
Platelets: 237 K/uL (ref 150–400)
RBC: 4.12 MIL/uL — ABNORMAL LOW (ref 4.22–5.81)
RDW: 14.8 % (ref 11.5–15.5)
WBC: 10.5 K/uL (ref 4.0–10.5)

## 2012-06-04 LAB — GLUCOSE, CAPILLARY
Glucose-Capillary: 116 mg/dL — ABNORMAL HIGH (ref 70–99)
Glucose-Capillary: 126 mg/dL — ABNORMAL HIGH (ref 70–99)
Glucose-Capillary: 169 mg/dL — ABNORMAL HIGH (ref 70–99)
Glucose-Capillary: 180 mg/dL — ABNORMAL HIGH (ref 70–99)
Glucose-Capillary: 183 mg/dL — ABNORMAL HIGH (ref 70–99)

## 2012-06-04 LAB — MAGNESIUM: Magnesium: 1.3 mg/dL — ABNORMAL LOW (ref 1.5–2.5)

## 2012-06-04 MED ORDER — AMOXICILLIN-POT CLAVULANATE 875-125 MG PO TABS
1.0000 | ORAL_TABLET | Freq: Two times a day (BID) | ORAL | Status: DC
  Administered 2012-06-04 – 2012-06-06 (×5): 1 via ORAL
  Filled 2012-06-04 (×6): qty 1

## 2012-06-04 MED ORDER — OXYCODONE-ACETAMINOPHEN 5-325 MG PO TABS
1.0000 | ORAL_TABLET | ORAL | Status: DC | PRN
  Administered 2012-06-04 – 2012-06-05 (×2): 2 via ORAL
  Filled 2012-06-04 (×3): qty 2

## 2012-06-04 NOTE — Progress Notes (Signed)
Subjective: Pt feels much better than yesterday. Sipping some clear liquids.   Objective: Physical Exam: BP 90/57  Pulse 72  Temp 98.1 F (36.7 C) (Oral)  Resp 18  Ht 5\' 6"  (1.676 m)  Wt 178 lb 2.1 oz (80.8 kg)  BMI 28.75 kg/m2  SpO2 95% Mildly tender lower abdomen. Drain intact, site clean. Puro-feculent output-about 17.5cc yesterday, about 10cc in bulb now.   Labs: CBC  Basename 06/04/12 0340 06/03/12 0355  WBC 10.5 13.1*  HGB 10.8* 11.7*  HCT 32.0* 33.7*  PLT 237 239   BMET  Basename 06/04/12 0340 06/03/12 1644  NA 134* 137  K 3.3* 3.3*  CL 98 97  CO2 28 30  GLUCOSE 193* 192*  BUN 8 9  CREATININE 1.23 1.31  CALCIUM 8.5 9.1   LFT  Basename 06/02/12 2010  PROT 8.1  ALBUMIN 4.1  AST 38*  ALT 28  ALKPHOS 51  BILITOT 1.1  BILIDIR --  IBILI --  LIPASE 27   PT/INR  Basename 06/03/12 1030  LABPROT 15.2  INR 1.18     Studies/Results: Ct Guided Abscess Drain  06/03/2012  *RADIOLOGY REPORT*  CT GUIDED ABSCESS DRAIN  Date: 06/03/12  Clinical History: 36 year old male with a history of a perforated appendicitis and periappendiceal abscess.  Presents for CT guided drain placement.  Procedures Performed: 1. CT guided intra-abdominal drain placement  Interventional Radiologist:  Sterling Big, MD  Sedation: Moderate (conscious) sedation was used.  4 mg Versed, 100 mcg Fentanyl were administered intravenously.  The patient's vital signs were monitored continuously by radiology nursing throughout the procedure.  Sedation Time: 28 minutes  PROCEDURE/FINDINGS:   Informed consent was obtained from the patient following explanation of the procedure, risks, benefits and alternatives. The patient understands, agrees and consents for the procedure. All questions were addressed.  A time out was performed.  Maximal barrier sterile technique utilized including caps, mask, sterile gowns, sterile gloves, large sterile drape, hand hygiene, and skin prep with chlorhexidine.   It planning axial CT scan was performed confirming the location of the 2.6 x 2.1 cm abscess at the appendiceal tip immediately adjacent to an appendicolith.  There is significant inflammatory stranding surrounding the abscess collection.  An appropriate skin entry site was selected and marked.  Local anesthesia was then obtained with 1% lidocaine.  An 18 gauge single wall needle was then advanced under direct CT guidance through the skin, soft tissues, rectus abdominal muscle, peritoneal lining and into the periappendiceal abscess collection. Location of the needle tip was confirmed with axial CT imaging.  A short Amplatz wire was advanced through the needle and coiled within the abscess cavity.  The tract was then serially dilated and a 10.2 [place] Dawson-Mueller drainage catheter positioned under CT guidance.  Approximately 5 ml of grossly feculent material was aspirated.  This sample will be sent for culture including anaerobic culture.  Axial CT imaging confirmed placement of the pigtail catheter within the abscess cavity and the successful aspiration of the gas and fluid material.  The catheter was secured to the skin with both Prolene and connected to bulb suction.  The patient tolerated the procedure well, there is no immediate complication.  The patient was returned to the floor in good condition.  IMPRESSION:  1.  Successful placement of a 10.2 French drainage catheter into the periappendiceal abscess collection. Grossly feculent material was aspirated and sent for culture.  2.  Please note that on CT imaging there is non loculated fluid within  the pelvis in the rectovesicular recess.  This likely represents reactive peritoneal fluid, however infection is difficult to exclude completely.  If the patient does not respond clinically to intravenous antibiotics and percutaneous drainage of the periappendiceal abscess, consider repeat imaging to assess for development of a deep pelvic abscess.  If this occurs, a  transgluteal drain could likely be placed.  Signed,  Sterling Big, MD Vascular & Interventional Radiologist [facility]  Original Report Authenticated By: Threasa Beards Abdomen Pelvis W Contrast  06/02/2012  *RADIOLOGY REPORT*  Clinical Data: Lower abdominal pain since Saturday.  Nausea, vomiting, and diarrhea.  Burning with urination.  CT ABDOMEN AND PELVIS WITH CONTRAST  Technique:  Multidetector CT imaging of the abdomen and pelvis was performed following the standard protocol during bolus administration of intravenous contrast.  Contrast: OMNIPAQUE IOHEXOL 300 MG/ML  SOLN  Comparison: None.  Findings: Mild dependent atelectasis in the lung bases.  The liver, spleen, gallbladder, adrenal glands, pancreas, abdominal aorta, and retroperitoneal lymph nodes are unremarkable.  Sub centimeter parenchymal cysts in the kidneys.  No solid mass or hydronephrosis.  The stomach and small bowel are not distended. Stool filled colon without distension.  No free air or free fluid in the abdomen.  In the lower abdominal mesentery, there is an inflammatory process present with infiltration in the mesenteric fat surrounding the large appendix containing an appendicolith.  The proximal portion of the appendix measures about 11 cm diameter.  The appendiceal tip is distended to about 2.1 cm diameter and contains an air-fluid levels suggesting appendiceal/periappendiceal abscess.  Changes are consistent with acute appendicitis.  Pelvis:  There is a small amount of free fluid in the pelvis, likely representing reactive inflammation.  The prostate gland is not enlarged.  Mild bladder wall thickening which might represent reactive inflammation as the appendix is located over the dome of the bladder.  No significant pelvic lymphadenopathy.  No evidence of diverticulitis. There is normal alignment of the lumbar vertebrae.  IMPRESSION: Changes of acute appendicitis with periappendiceal infiltration and 2.1 cm diameter  abscess at the appendiceal tip.  Appendicolith is present.  Bladder wall thickening may be due to reactive inflammation as the appendix extends across the dome of the bladder.  Original Report Authenticated By: Marlon Pel, M.D.   Dg Abd Acute W/chest  06/02/2012  *RADIOLOGY REPORT*  Clinical Data: Abdominal distension  ACUTE ABDOMEN SERIES (ABDOMEN 2 VIEW & CHEST 1 VIEW)  Comparison: Chest radiograph - 08/20/2011  Findings:  Grossly unchanged cardiac silhouette and mediastinal contours. Lung volumes remain persistently reduced.  Improved aeration of the right mid lung.  No focal airspace opacities.  No definite pleural effusion or pneumothorax.  There is an overall paucity of bowel gas without definite evidence of obstruction.  No pneumoperitoneum, pneumatosis or portal venous gas. No definite abnormal intra-abdominal calcifications.  No acute osseous abnormalities.  IMPRESSION: 1.  Decreased lung volumes without acute cardiopulmonary disease. 2.  Paucity of bowel gas without definite evidence of obstruction.  Original Report Authenticated By: Waynard Reeds, M.D.    Assessment/Plan: Ruptured appendicitis with abscess S/p perc drain 7/16. WBC down Cx pending: GPR/GNR on gram stain. Cont to follow. Possible DC later this week with drain.    LOS: 2 days    Brayton El PA-C 06/04/2012 10:34 AM

## 2012-06-04 NOTE — Progress Notes (Signed)
Patient ID: Paul Key, male   DOB: [birth year], 36 y.o.   MRN: [id number]    Subjective: Says he feels better, not very conversational. Flat affect.  Objective: Vital signs in last 24 hours: Temp:  [98.1 F (36.7 C)-100.5 F (38.1 C)] 98.1 F (36.7 C) (07/17 0537) Pulse Rate:  [72-90] 72  (07/17 0537) Resp:  [13-21] 18  (07/17 0537) BP: (90-124)/(56-81) 90/57 mmHg (07/17 0537) SpO2:  [92 %-100 %] 95 % (07/17 0537) Last BM Date: 06/02/12  Temp up to 101.8 6AM.  VSS,K+ up to 3.1  Intake/Output from previous day: 07/16 0701 - 07/17 0700 In: 3487.5 [I.V.:2987.5; IV Piggyback:500] Out: 817.5 [Urine:800; Drains:17.5] Intake/Output this shift: Total I/O In: 736.3 [P.O.:180; I.V.:556.3] Out: 7.5 [Drains:7.5]  General: Still with RLQ tenderness, abdominal drain contains dark brown fluid < 20 cc now (17.5 ml previous 24 hr period.) +BS, flatus. Chest: CTA Extremities:no edema Remains afebrile  Lab Results:   Basename 06/04/12 0340 06/03/12 0355  WBC 10.5 13.1*  HGB 10.8* 11.7*  HCT 32.0* 33.7*  PLT 237 239    BMET  Basename 06/04/12 0340 06/03/12 1644  NA 134* 137  K 3.3* 3.3*  CL 98 97  CO2 28 30  GLUCOSE 193* 192*  BUN 8 9  CREATININE 1.23 1.31  CALCIUM 8.5 9.1   PT/INR  Basename 06/03/12 1030  LABPROT 15.2  INR 1.18     Lab 06/02/12 2010  AST 38*  ALT 28  ALKPHOS 51  BILITOT 1.1  PROT 8.1  ALBUMIN 4.1     Lipase     Component Value Date/Time   LIPASE 27 06/02/2012 2010     Studies/Results: Ct Guided Abscess Drain  06/03/2012  *RADIOLOGY REPORT*  CT GUIDED ABSCESS DRAIN  Date: 06/03/12  Clinical History: 36 year old male with a history of a perforated appendicitis and periappendiceal abscess.  Presents for CT guided drain placement.  Procedures Performed: 1. CT guided intra-abdominal drain placement  Interventional Radiologist:  Sterling Big, MD  Sedation: Moderate (conscious) sedation was used.  4 mg Versed, 100 mcg Fentanyl were  administered intravenously.  The patient's vital signs were monitored continuously by radiology nursing throughout the procedure.  Sedation Time: 28 minutes  PROCEDURE/FINDINGS:   Informed consent was obtained from the patient following explanation of the procedure, risks, benefits and alternatives. The patient understands, agrees and consents for the procedure. All questions were addressed.  A time out was performed.  Maximal barrier sterile technique utilized including caps, mask, sterile gowns, sterile gloves, large sterile drape, hand hygiene, and skin prep with chlorhexidine.  It planning axial CT scan was performed confirming the location of the 2.6 x 2.1 cm abscess at the appendiceal tip immediately adjacent to an appendicolith.  There is significant inflammatory stranding surrounding the abscess collection.  An appropriate skin entry site was selected and marked.  Local anesthesia was then obtained with 1% lidocaine.  An 18 gauge single wall needle was then advanced under direct CT guidance through the skin, soft tissues, rectus abdominal muscle, peritoneal lining and into the periappendiceal abscess collection. Location of the needle tip was confirmed with axial CT imaging.  A short Amplatz wire was advanced through the needle and coiled within the abscess cavity.  The tract was then serially dilated and a 10.2 [place] Dawson-Mueller drainage catheter positioned under CT guidance.  Approximately 5 ml of grossly feculent material was aspirated.  This sample will be sent for culture including anaerobic culture.  Axial CT imaging confirmed placement of  the pigtail catheter within the abscess cavity and the successful aspiration of the gas and fluid material.  The catheter was secured to the skin with both Prolene and connected to bulb suction.  The patient tolerated the procedure well, there is no immediate complication.  The patient was returned to the floor in good condition.  IMPRESSION:  1.  Successful  placement of a 10.2 French drainage catheter into the periappendiceal abscess collection. Grossly feculent material was aspirated and sent for culture.  2.  Please note that on CT imaging there is non loculated fluid within the pelvis in the rectovesicular recess.  This likely represents reactive peritoneal fluid, however infection is difficult to exclude completely.  If the patient does not respond clinically to intravenous antibiotics and percutaneous drainage of the periappendiceal abscess, consider repeat imaging to assess for development of a deep pelvic abscess.  If this occurs, a transgluteal drain could likely be placed.  Signed,  Sterling Big, MD Vascular & Interventional Radiologist [facility]  Original Report Authenticated By: Threasa Beards Abdomen Pelvis W Contrast  06/02/2012  *RADIOLOGY REPORT*  Clinical Data: Lower abdominal pain since Saturday.  Nausea, vomiting, and diarrhea.  Burning with urination.  CT ABDOMEN AND PELVIS WITH CONTRAST  Technique:  Multidetector CT imaging of the abdomen and pelvis was performed following the standard protocol during bolus administration of intravenous contrast.  Contrast: OMNIPAQUE IOHEXOL 300 MG/ML  SOLN  Comparison: None.  Findings: Mild dependent atelectasis in the lung bases.  The liver, spleen, gallbladder, adrenal glands, pancreas, abdominal aorta, and retroperitoneal lymph nodes are unremarkable.  Sub centimeter parenchymal cysts in the kidneys.  No solid mass or hydronephrosis.  The stomach and small bowel are not distended. Stool filled colon without distension.  No free air or free fluid in the abdomen.  In the lower abdominal mesentery, there is an inflammatory process present with infiltration in the mesenteric fat surrounding the large appendix containing an appendicolith.  The proximal portion of the appendix measures about 11 cm diameter.  The appendiceal tip is distended to about 2.1 cm diameter and contains an air-fluid  levels suggesting appendiceal/periappendiceal abscess.  Changes are consistent with acute appendicitis.  Pelvis:  There is a small amount of free fluid in the pelvis, likely representing reactive inflammation.  The prostate gland is not enlarged.  Mild bladder wall thickening which might represent reactive inflammation as the appendix is located over the dome of the bladder.  No significant pelvic lymphadenopathy.  No evidence of diverticulitis. There is normal alignment of the lumbar vertebrae.  IMPRESSION: Changes of acute appendicitis with periappendiceal infiltration and 2.1 cm diameter abscess at the appendiceal tip.  Appendicolith is present.  Bladder wall thickening may be due to reactive inflammation as the appendix extends across the dome of the bladder.  Original Report Authenticated By: Marlon Pel, M.D.   Dg Abd Acute W/chest  06/02/2012  *RADIOLOGY REPORT*  Clinical Data: Abdominal distension  ACUTE ABDOMEN SERIES (ABDOMEN 2 VIEW & CHEST 1 VIEW)  Comparison: Chest radiograph - 08/20/2011  Findings:  Grossly unchanged cardiac silhouette and mediastinal contours. Lung volumes remain persistently reduced.  Improved aeration of the right mid lung.  No focal airspace opacities.  No definite pleural effusion or pneumothorax.  There is an overall paucity of bowel gas without definite evidence of obstruction.  No pneumoperitoneum, pneumatosis or portal venous gas. No definite abnormal intra-abdominal calcifications.  No acute osseous abnormalities.  IMPRESSION: 1.  Decreased lung volumes without  acute cardiopulmonary disease. 2.  Paucity of bowel gas without definite evidence of obstruction.  Original Report Authenticated By: Waynard Reeds, M.D.    Medications:    . acetaminophen  1,000 mg Intravenous Q6H  . antiseptic oral rinse  15 mL Mouth Rinse q12n4p  . chlorhexidine  15 mL Mouth Rinse BID  . fentaNYL      . insulin aspart  0-15 Units Subcutaneous Q4H  . midazolam      .  pantoprazole (PROTONIX) IV  40 mg Intravenous QHS  . piperacillin-tazobactam (ZOSYN)  IV  3.375 g Intravenous Q8H  . potassium chloride  10 mEq Intravenous Q1 Hr x 4    Assessment/Plan Acute appendicitis with abscess  Dehydration Hypokalemia  Plan: advance diet to full liquid today Change to po abx Possible discharge tomorrow Recheck labs in am  LOS: 2 days    DUANNE, JEREMY 06/04/2012 Will start oral abx.  Possible discharge tomorrow

## 2012-06-05 LAB — GLUCOSE, CAPILLARY
Glucose-Capillary: 109 mg/dL — ABNORMAL HIGH (ref 70–99)
Glucose-Capillary: 215 mg/dL — ABNORMAL HIGH (ref 70–99)
Glucose-Capillary: 242 mg/dL — ABNORMAL HIGH (ref 70–99)

## 2012-06-05 MED ORDER — PANTOPRAZOLE SODIUM 40 MG PO TBEC
40.0000 mg | DELAYED_RELEASE_TABLET | Freq: Every day | ORAL | Status: DC
  Administered 2012-06-05 – 2012-06-06 (×2): 40 mg via ORAL
  Filled 2012-06-05: qty 1

## 2012-06-05 NOTE — Progress Notes (Signed)
Subjective: Pt states he feels better; has some soreness at drain site left pelvic region; has ambulated; tol liquid diet  Objective: Vital signs in last 24 hours: Temp:  [97.9 F (36.6 C)-98.3 F (36.8 C)] 98.3 F (36.8 C) (07/18 1610) Pulse Rate:  [73-82] 77  (07/18 0608) Resp:  [18] 18  (07/18 0608) BP: (103-107)/(61-73) 103/67 mmHg (07/18 0608) SpO2:  [95 %-98 %] 95 % (07/18 0608) Last BM Date: 06/05/12  Intake/Output from previous day: 07/17 0701 - 07/18 0700 In: 1936.3 [P.O.:1380; I.V.:556.3] Out: 1997.5 [Urine:1900; Drains:97.5] Intake/Output this shift: Total I/O In: 840 [P.O.:840] Out: 10 [Drains:10]  LLQ drain intact, insertion site ok, mild-mod tender to palpation, output about 10 cc's foul smelling, feculent material, cx's - e coli (prelim)  Lab Results:   [facility] 06/04/12 0340 06/03/12 0355  WBC 10.5 13.1*  HGB 10.8* 11.7*  HCT 32.0* 33.7*  PLT 237 239   BMET  Basename 06/04/12 0340 06/03/12 1644  NA 134* 137  K 3.3* 3.3*  CL 98 97  CO2 28 30  GLUCOSE 193* 192*  BUN 8 9  CREATININE 1.23 1.31  CALCIUM 8.5 9.1   PT/INR  Basename 06/03/12 1030  LABPROT 15.2  INR 1.18   ABG No results found for this basename: PHART:2,PCO2:2,PO2:2,HCO3:2 in the last 72 hours Results for orders placed during the hospital encounter of 06/02/12  CULTURE, ROUTINE-ABSCESS     Status: Normal (Preliminary result)   Collection Time   06/03/12 12:37 PM      Component Value Range Status Comment   Specimen Description PERITONEAL CAVITY   Final    Special Requests NONE   Final    Gram Stain     Final    Value: NO WBC SEEN     NO SQUAMOUS EPITHELIAL CELLS SEEN     RARE GRAM POSITIVE RODS     MODERATE GRAM NEGATIVE RODS   Culture MODERATE ESCHERICHIA COLI   Final    Report Status PENDING   Incomplete   ANAEROBIC CULTURE     Status: Normal (Preliminary result)   Collection Time   06/03/12 12:37 PM      Component Value Range Status Comment   Specimen Description  PERITONEAL CAVITY   Final    Special Requests NONE   Final    Gram Stain     Final    Value: NO WBC SEEN     NO SQUAMOUS EPITHELIAL CELLS SEEN     RARE GRAM POSITIVE RODS     MODERATE GRAM NEGATIVE RODS   Culture     Final    Value: NO ANAEROBES ISOLATED; CULTURE IN PROGRESS FOR 5 DAYS   Report Status PENDING   Incomplete     Studies/Results: Ct Guided Abscess Drain  06/03/2012  *RADIOLOGY REPORT*  CT GUIDED ABSCESS DRAIN  Date: 06/03/12  Clinical History: 36 year old male with a history of a perforated appendicitis and periappendiceal abscess.  Presents for CT guided drain placement.  Procedures Performed: 1. CT guided intra-abdominal drain placement  Interventional Radiologist:  Sterling Big, MD  Sedation: Moderate (conscious) sedation was used.  4 mg Versed, 100 mcg Fentanyl were administered intravenously.  The patient's vital signs were monitored continuously by radiology nursing throughout the procedure.  Sedation Time: 28 minutes  PROCEDURE/FINDINGS:   Informed consent was obtained from the patient following explanation of the procedure, risks, benefits and alternatives. The patient understands, agrees and consents for the procedure. All questions were addressed.  A time out was performed.  Maximal barrier sterile technique utilized including caps, mask, sterile gowns, sterile gloves, large sterile drape, hand hygiene, and skin prep with chlorhexidine.  It planning axial CT scan was performed confirming the location of the 2.6 x 2.1 cm abscess at the appendiceal tip immediately adjacent to an appendicolith.  There is significant inflammatory stranding surrounding the abscess collection.  An appropriate skin entry site was selected and marked.  Local anesthesia was then obtained with 1% lidocaine.  An 18 gauge single wall needle was then advanced under direct CT guidance through the skin, soft tissues, rectus abdominal muscle, peritoneal lining and into the periappendiceal abscess  collection. Location of the needle tip was confirmed with axial CT imaging.  A short Amplatz wire was advanced through the needle and coiled within the abscess cavity.  The tract was then serially dilated and a 10.2 [place] Dawson-Mueller drainage catheter positioned under CT guidance.  Approximately 5 ml of grossly feculent material was aspirated.  This sample will be sent for culture including anaerobic culture.  Axial CT imaging confirmed placement of the pigtail catheter within the abscess cavity and the successful aspiration of the gas and fluid material.  The catheter was secured to the skin with both Prolene and connected to bulb suction.  The patient tolerated the procedure well, there is no immediate complication.  The patient was returned to the floor in good condition.  IMPRESSION:  1.  Successful placement of a 10.2 French drainage catheter into the periappendiceal abscess collection. Grossly feculent material was aspirated and sent for culture.  2.  Please note that on CT imaging there is non loculated fluid within the pelvis in the rectovesicular recess.  This likely represents reactive peritoneal fluid, however infection is difficult to exclude completely.  If the patient does not respond clinically to intravenous antibiotics and percutaneous drainage of the periappendiceal abscess, consider repeat imaging to assess for development of a deep pelvic abscess.  If this occurs, a transgluteal drain could likely be placed.  Signed,  Sterling Big, MD Vascular & Interventional Radiologist [facility]  Original Report Authenticated By: Vilma Prader    Anti-infectives: Anti-infectives     Start     Dose/Rate Route Frequency Ordered Stop   06/04/12 1400   amoxicillin-clavulanate (AUGMENTIN) 875-125 MG per tablet 1 tablet        1 tablet Oral Every 12 hours 06/04/12 1224     06/03/12 0600   piperacillin-tazobactam (ZOSYN) IVPB 3.375 g  Status:  Discontinued        3.375 g 12.5 mL/hr over 240  Minutes Intravenous 3 times per day 06/02/12 2303 06/04/12 1223   06/02/12 2230   piperacillin-tazobactam (ZOSYN) IVPB 3.375 g        3.375 g 100 mL/hr over 30 Minutes Intravenous  Once 06/02/12 2206 06/02/12 2322          Assessment/Plan: s/p periappendiceal abscess drainage 7/16; check final cx's /sens; check f/u CT with drain injection within week of placement; if pt is d/c'd home with drain recommend once daily flushes with sterile NS, recording of output and dressing changes every one to two days.   LOS: 3 days    Ailanie Ruttan,D [facility] 06/05/2012

## 2012-06-05 NOTE — Progress Notes (Signed)
The patient is receiving Protonix by the intravenous route. Based on criteria approved by the Pharmacy and Therapeutics Committee and the Medical Executive Committee, the medication is being converted to the equivalent oral dose form.   These criteria include:  -No Active GI bleeding  -Able to tolerate diet of full liquids (or better) or tube feeding  -Able to tolerate other medications by the oral or enteral route   If you have any questions about this conversion, please contact the Pharmacy Department ([phone] Thank you.   Loralee Pacas, PharmD, BCPS 06/05/2012 11:39 AM

## 2012-06-05 NOTE — Progress Notes (Signed)
Patient ID: Paul Key, male   DOB: 1976/10/03, 36 y.o.   MRN: [id number] Patient ID: Paul Key, male   DOB: 1976-05-14, 36 y.o.   MRN: [id number]    Subjective: Says he feels better, still not very conversational. Flat affect. But a little more talkative today when asked questions.  Objective: Vital signs in last 24 hours: Temp:  [97.9 F (36.6 C)-98.3 F (36.8 C)] 98.3 F (36.8 C) (07/18 2440) Pulse Rate:  [73-82] 77  (07/18 0608) Resp:  [18] 18  (07/18 0608) BP: (103-107)/(61-73) 103/67 mmHg (07/18 0608) SpO2:  [95 %-98 %] 95 % (07/18 0608) Last BM Date: 06/05/12    Intake/Output from previous day: 07/17 0701 - 07/18 0700 In: 1936.3 [P.O.:1380; I.V.:556.3] Out: 1997.5 [Urine:1900; Drains:97.5] Intake/Output this shift: Total I/O In: 840 [P.O.:840] Out: 10 [Drains:10]  General: Denies RLQ tenderness today, abdominal drain contains dark brown fluid  (97.4 ml previous 24 hr period.) +BS, flatus. Chest: CTA Extremities:no edema Remains afebrile  Lab Results:   Basename 06/04/12 0340 06/03/12 0355  WBC 10.5 13.1*  HGB 10.8* 11.7*  HCT 32.0* 33.7*  PLT 237 239    BMET  Basename 06/04/12 0340 06/03/12 1644  NA 134* 137  K 3.3* 3.3*  CL 98 97  CO2 28 30  GLUCOSE 193* 192*  BUN 8 9  CREATININE 1.23 1.31  CALCIUM 8.5 9.1   PT/INR  Basename 06/03/12 1030  LABPROT 15.2  INR 1.18     Lab 06/02/12 2010  AST 38*  ALT 28  ALKPHOS 51  BILITOT 1.1  PROT 8.1  ALBUMIN 4.1     Lipase     Component Value Date/Time   LIPASE 27 06/02/2012 2010     Studies/Results: Ct Guided Abscess Drain  06/03/2012  *RADIOLOGY REPORT*  CT GUIDED ABSCESS DRAIN  Date: 06/03/12  Clinical History: 36 year old male with a history of a perforated appendicitis and periappendiceal abscess.  Presents for CT guided drain placement.  Procedures Performed: 1. CT guided intra-abdominal drain placement  Interventional Radiologist:  Sterling Big, MD  Sedation: Moderate (conscious)  sedation was used.  4 mg Versed, 100 mcg Fentanyl were administered intravenously.  The patient's vital signs were monitored continuously by radiology nursing throughout the procedure.  Sedation Time: 28 minutes  PROCEDURE/FINDINGS:   Informed consent was obtained from the patient following explanation of the procedure, risks, benefits and alternatives. The patient understands, agrees and consents for the procedure. All questions were addressed.  A time out was performed.  Maximal barrier sterile technique utilized including caps, mask, sterile gowns, sterile gloves, large sterile drape, hand hygiene, and skin prep with chlorhexidine.  It planning axial CT scan was performed confirming the location of the 2.6 x 2.1 cm abscess at the appendiceal tip immediately adjacent to an appendicolith.  There is significant inflammatory stranding surrounding the abscess collection.  An appropriate skin entry site was selected and marked.  Local anesthesia was then obtained with 1% lidocaine.  An 18 gauge single wall needle was then advanced under direct CT guidance through the skin, soft tissues, rectus abdominal muscle, peritoneal lining and into the periappendiceal abscess collection. Location of the needle tip was confirmed with axial CT imaging.  A short Amplatz wire was advanced through the needle and coiled within the abscess cavity.  The tract was then serially dilated and a 10.2 [place] Dawson-Mueller drainage catheter positioned under CT guidance.  Approximately 5 ml of grossly feculent material was aspirated.  This sample will be sent for  culture including anaerobic culture.  Axial CT imaging confirmed placement of the pigtail catheter within the abscess cavity and the successful aspiration of the gas and fluid material.  The catheter was secured to the skin with both Prolene and connected to bulb suction.  The patient tolerated the procedure well, there is no immediate complication.  The patient was returned to the  floor in good condition.  IMPRESSION:  1.  Successful placement of a 10.2 French drainage catheter into the periappendiceal abscess collection. Grossly feculent material was aspirated and sent for culture.  2.  Please note that on CT imaging there is non loculated fluid within the pelvis in the rectovesicular recess.  This likely represents reactive peritoneal fluid, however infection is difficult to exclude completely.  If the patient does not respond clinically to intravenous antibiotics and percutaneous drainage of the periappendiceal abscess, consider repeat imaging to assess for development of a deep pelvic abscess.  If this occurs, a transgluteal drain could likely be placed.  Signed,  Sterling Big, MD Vascular & Interventional Radiologist [facility]  Original Report Authenticated By: Vilma Prader    Medications:    . acetaminophen  1,000 mg Intravenous Q6H  . amoxicillin-clavulanate  1 tablet Oral Q12H  . antiseptic oral rinse  15 mL Mouth Rinse q12n4p  . chlorhexidine  15 mL Mouth Rinse BID  . insulin aspart  0-15 Units Subcutaneous Q4H  . pantoprazole (PROTONIX) IV  40 mg Intravenous QHS  . DISCONTD: piperacillin-tazobactam (ZOSYN)  IV  3.375 g Intravenous Q8H    Assessment/Plan Acute appendicitis with abscess  Dehydration Hypokalemia  Plan: advance diet to full liquid today Change to po abx Possible discharge today  LOS: 3 days    Shamicka Inga 06/05/2012

## 2012-06-06 LAB — GLUCOSE, CAPILLARY
Glucose-Capillary: 151 mg/dL — ABNORMAL HIGH (ref 70–99)
Glucose-Capillary: 158 mg/dL — ABNORMAL HIGH (ref 70–99)
Glucose-Capillary: 142 mg/dL — ABNORMAL HIGH (ref 70–99)

## 2012-06-06 LAB — CULTURE, ROUTINE-ABSCESS

## 2012-06-06 LAB — APTT: aPTT: 38 seconds — ABNORMAL HIGH (ref 24–37)

## 2012-06-06 MED ORDER — AMOXICILLIN-POT CLAVULANATE 875-125 MG PO TABS: 1.0000 | ORAL_TABLET | Freq: Two times a day (BID) | ORAL | Status: AC

## 2012-06-06 MED ORDER — OXYCODONE-ACETAMINOPHEN 5-325 MG PO TABS: 1.0000 | ORAL_TABLET | ORAL | Status: AC | PRN

## 2012-06-06 NOTE — Discharge Summary (Signed)
Physician Discharge Summary  Patient ID: Josealberto Montalto MRN: [id number] DOB/AGE: 38-31-1977 38 y.o.  Admit date: 06/02/2012 Discharge date: 06/06/2012  Admission Diagnoses: abdominal pain  Discharge Diagnoses: appendicitis with abcess Active Problems:  * No active hospital problems. *    Discharged Condition: stable  Hospital Course: This is a 36 year old male who developed centralized abdominal pain 05/30/2012. Awakened, the pain persisted and radiated to the lower abdomen. This was associated with subjective fever and chills as well as nausea and vomiting. He's not been able to keep anything down since Friday. He had a small amount of diarrhea. No hematemesis. No blood in the stool. No dysuria or hematuria. He presented to the emergency department and evaluation demonstrated appendicitis with an abscess and I was asked to see him because of this.  Consults: None  Significant Diagnostic Studies: labs and microbiology.  Treatments: IV hydration, antibiotics, analgesia, placement of percutaneous drain. Discharge Exam: Blood pressure 97/59, pulse 75, temperature 100 F (37.8 C), temperature source Oral, resp. rate 20, height 5\' 6"  (1.676 m), weight 178 lb 2.1 oz (80.8 kg), SpO2 85.00%. General appearance: alert, cooperative, appears stated age, no distress and slowed mentation Abdomen: soft, non tender, no rebound tenderness, + BS,BM, flatus,perc drain remains in place (24 ml of dark brown fluid over past 24 hours)  VS stable afebrile  Disposition: 01-Home or Self Care   Medication List  As of 06/06/2012 11:41 AM   ASK your doctor about these medications         metFORMIN 500 MG tablet   Commonly known as: GLUCOPHAGE   Take 500 mg by mouth daily with breakfast.      pravastatin 40 MG tablet   Commonly known as: PRAVACHOL   Take 40 mg by mouth daily.             SignedBlenda Mounts 06/06/2012, 11:41 AM

## 2012-06-06 NOTE — Progress Notes (Signed)
Subjective: Pt doing ok; tolerating diet ok; some soreness at drain site; no nausea/vomiting  Objective: Vital signs in last 24 hours: Temp:  [98 F (36.7 C)-100 F (37.8 C)] 100 F (37.8 C) (07/19 0605) Pulse Rate:  [66-79] 75  (07/19 0605) Resp:  [16-20] 20  (07/19 0605) BP: (97-116)/(59-75) 97/59 mmHg (07/19 0605) SpO2:  [85 %-97 %] 85 % (07/19 0605) Last BM Date: 06/05/12  Intake/Output from previous day: 07/18 0701 - 07/19 0700 In: 2243 [P.O.:2240] Out: 1314 [Urine:1290; Drains:24] Intake/Output this shift: Total I/O In: -  Out: 800 [Urine:800]  LLQ drain intact, insertion site ok, output about 25 cc's light brown/feculent material last 24 hrs; drain flushed with 5 cc's sterile NS with return of same  Lab Results:   [facility] 06/04/12 0340  WBC 10.5  HGB 10.8*  HCT 32.0*  PLT 237   BMET  Basename 06/04/12 0340 06/03/12 1644  NA 134* 137  K 3.3* 3.3*  CL 98 97  CO2 28 30  GLUCOSE 193* 192*  BUN 8 9  CREATININE 1.23 1.31  CALCIUM 8.5 9.1   PT/INR  Basename 06/03/12 1030  LABPROT 15.2  INR 1.18   ABG No results found for this basename: PHART:2,PCO2:2,PO2:2,HCO3:2 in the last 72 hours Results for orders placed during the hospital encounter of 06/02/12  CULTURE, ROUTINE-ABSCESS     Status: Normal (Preliminary result)   Collection Time   06/03/12 12:37 PM      Component Value Range Status Comment   Specimen Description PERITONEAL CAVITY   Final    Special Requests NONE   Final    Gram Stain     Final    Value: NO WBC SEEN     NO SQUAMOUS EPITHELIAL CELLS SEEN     RARE GRAM POSITIVE RODS     MODERATE GRAM NEGATIVE RODS   Culture MODERATE ESCHERICHIA COLI   Final    Report Status PENDING   Incomplete   ANAEROBIC CULTURE     Status: Normal (Preliminary result)   Collection Time   06/03/12 12:37 PM      Component Value Range Status Comment   Specimen Description PERITONEAL CAVITY   Final    Special Requests NONE   Final    Gram Stain     Final    Value: NO WBC SEEN     NO SQUAMOUS EPITHELIAL CELLS SEEN     RARE GRAM POSITIVE RODS     MODERATE GRAM NEGATIVE RODS   Culture     Final    Value: NO ANAEROBES ISOLATED; CULTURE IN PROGRESS FOR 5 DAYS   Report Status PENDING   Incomplete     Studies/Results: No results found.  Anti-infectives: Anti-infectives     Start     Dose/Rate Route Frequency Ordered Stop   06/04/12 1400   amoxicillin-clavulanate (AUGMENTIN) 875-125 MG per tablet 1 tablet        1 tablet Oral Every 12 hours 06/04/12 1224     06/03/12 0600   piperacillin-tazobactam (ZOSYN) IVPB 3.375 g  Status:  Discontinued        3.375 g 12.5 mL/hr over 240 Minutes Intravenous 3 times per day 06/02/12 2303 06/04/12 1223   06/02/12 2230   piperacillin-tazobactam (ZOSYN) IVPB 3.375 g        3.375 g 100 mL/hr over 30 Minutes Intravenous  Once 06/02/12 2206 06/02/12 2322          Assessment/Plan: s/p periappendiceal abscess drainage 7/16; probable d/c home today with  drain per [facility]; recommend daily flushes of drain with 5-10 cc's sterile NS, recording of output and dressing changes every 1-2 days; will need f/u CT with drain injection within 1 week of drain placement.   LOS: 4 days    [facility] 06/06/2012

## 2012-06-08 LAB — ANAEROBIC CULTURE: Gram Stain: NONE SEEN

## 2012-06-11 ENCOUNTER — Telehealth (INDEPENDENT_AMBULATORY_CARE_PROVIDER_SITE_OTHER): Payer: Self-pay

## 2012-06-12 ENCOUNTER — Telehealth (INDEPENDENT_AMBULATORY_CARE_PROVIDER_SITE_OTHER): Payer: Self-pay | Admitting: General Surgery

## 2012-06-12 NOTE — Telephone Encounter (Signed)
The patients mother contacted the office stating that the patients wound was becoming more malodorous, the drainage was yellow and is now becoming a bit darker. Had her take the patients temperature while I was on the phone and it read 97 according to his mother. Expressed to her if his temp goes above 101 and he has increased pain, vomiting and diarrhea she needs to take him back to the ED. He is on service with [facility]/ Onalee Hua is his home care nurse [phone], spoke with Onalee Hua and he stated the smell is bad and he cant say wether it is worse or not he also states there has only been 5ccs if drainage in the last 2 days. Asked Onalee Hua to call a report on drainage to the office on 06/13/12.

## 2012-06-12 NOTE — Telephone Encounter (Signed)
Verbal order given to extend home visits until patient returns for his post op appointment.

## 2012-06-17 ENCOUNTER — Telehealth (INDEPENDENT_AMBULATORY_CARE_PROVIDER_SITE_OTHER): Payer: Self-pay

## 2012-06-17 NOTE — Telephone Encounter (Signed)
[facility] nurse called to report drainage still has foul odor, and is a yellow/gray color. No fever, redness, or irritation around drain site.  Dr. Biagio Quint will see the pt for Dr. Abbey Chatters on 06/20/12.

## 2012-06-20 ENCOUNTER — Ambulatory Visit (INDEPENDENT_AMBULATORY_CARE_PROVIDER_SITE_OTHER): Payer: PRIVATE HEALTH INSURANCE | Admitting: General Surgery

## 2012-06-20 ENCOUNTER — Other Ambulatory Visit: Payer: Self-pay | Admitting: Cardiology

## 2012-06-20 ENCOUNTER — Encounter (INDEPENDENT_AMBULATORY_CARE_PROVIDER_SITE_OTHER): Payer: Self-pay | Admitting: General Surgery

## 2012-06-20 VITALS — BP 122/68 | HR 68 | Temp 97.3°F | Resp 14 | Ht 66.5 in | Wt 167.5 lb

## 2012-06-20 DIAGNOSIS — K3533 Acute appendicitis with perforation and localized peritonitis, with abscess: Secondary | ICD-10-CM

## 2012-06-20 NOTE — Progress Notes (Signed)
Subjective:     Patient ID: Paul Key, male   DOB: 1976-03-22, 36 y.o.   MRN: [id number]  HPI This patient follows up status post perforated appendicitis with abscess and placement of percutaneous drain. He says that he is doing well and feels pretty much back to normal now. He denies any fevers or chills or abdominal pain. He is tolerating regular diet and his bowels are functioning normally. He says about 5 cc of foul drainage is coming out of the drain daily  Review of Systems     Objective:   Physical Exam He is in no acute distress and nontoxic-appearing His abdomen is soft and nontender on exam he has a drain coming from the left lower quadrant with some milky drainage and foul-smelling drainage    Assessment:     History of perforated appendicitis with abscess He seems to be improving. He remains on Augmentin but it appears as though he is clinically improved. He strained still has cephalic drainage and I have recommended CT scan of the abdomen prior to removal. We will set him up for CT scan of the abdomen and he will followup with Dr. Abbey Chatters after his CT scan to consider drain removal.    Plan:     We will check CT scan of the abdomen and he will finish his antibiotics. He will followup in about one week after his CT scan

## 2012-06-23 ENCOUNTER — Ambulatory Visit
Admission: RE | Admit: 2012-06-23 | Discharge: 2012-06-23 | Disposition: A | Discharge status: Home / Self Care | Payer: PRIVATE HEALTH INSURANCE | Source: Ambulatory Visit | Attending: General Surgery | Admitting: General Surgery

## 2012-06-23 DIAGNOSIS — K3533 Acute appendicitis with perforation and localized peritonitis, with abscess: Secondary | ICD-10-CM

## 2012-06-23 MED ORDER — IOHEXOL 300 MG/ML  SOLN
100.0000 mL | Freq: Once | INTRAMUSCULAR | Status: AC | PRN
  Administered 2012-06-23: 100 mL via INTRAVENOUS

## 2012-07-01 ENCOUNTER — Telehealth (INDEPENDENT_AMBULATORY_CARE_PROVIDER_SITE_OTHER): Payer: Self-pay

## 2012-07-01 ENCOUNTER — Ambulatory Visit (INDEPENDENT_AMBULATORY_CARE_PROVIDER_SITE_OTHER): Payer: Medicaid Other | Admitting: General Surgery

## 2012-07-01 ENCOUNTER — Other Ambulatory Visit (INDEPENDENT_AMBULATORY_CARE_PROVIDER_SITE_OTHER): Payer: Self-pay | Admitting: General Surgery

## 2012-07-01 ENCOUNTER — Encounter (INDEPENDENT_AMBULATORY_CARE_PROVIDER_SITE_OTHER): Payer: Self-pay | Admitting: General Surgery

## 2012-07-01 VITALS — BP 114/56 | HR 76 | Temp 97.6°F | Resp 16 | Ht 67.0 in | Wt 166.2 lb

## 2012-07-01 DIAGNOSIS — K3533 Acute appendicitis with perforation and localized peritonitis, with abscess: Secondary | ICD-10-CM

## 2012-07-01 NOTE — Telephone Encounter (Signed)
LMOV appt at [facility] on 07/04/12 1:00pm arrival for drain study.

## 2012-07-01 NOTE — Progress Notes (Signed)
Subjective:     Patient ID: Paul Key, male   DOB: [birth year], 36 y.o.   MRN: [id number]  HPI  He is here for his first post hospital visit after percutaneous drainage of perforated appendicitis with abscesss,  He responded well to this and feels much better. A CT scan done 8 days ago demonstrated resolution of the abscess and decreased inflammation.   Review of Systems  No fever or chills.  Pain around the drain site.  Drain output is 5-10 cc per day.     Objective:   Physical Exam General-WDWN in NAD Abd-soft, drain in lower midline with serous output, no tenderness    Assessment:     Perforated appendicitis with abscess s/p percutaneous drainage-doing well.    Plan:     Drain study in radiology and if no fistula is present will ask them to remove the drain.  RTC in 3 weeks at which time we will schedule interval appendectomy.

## 2012-07-01 NOTE — Telephone Encounter (Signed)
Close encounter 

## 2012-07-01 NOTE — Patient Instructions (Signed)
Activities as tolerated once drain is removed.

## 2012-07-03 ENCOUNTER — Telehealth (HOSPITAL_COMMUNITY): Payer: Self-pay | Admitting: General Surgery

## 2012-07-04 ENCOUNTER — Ambulatory Visit (HOSPITAL_COMMUNITY)
Admission: RE | Admit: 2012-07-04 | Discharge: 2012-07-04 | Disposition: A | Discharge status: Home / Self Care | Payer: PRIVATE HEALTH INSURANCE | Source: Ambulatory Visit | Attending: General Surgery | Admitting: General Surgery

## 2012-07-04 DIAGNOSIS — K3533 Acute appendicitis with perforation and localized peritonitis, with abscess: Secondary | ICD-10-CM | POA: Insufficient documentation

## 2012-07-04 DIAGNOSIS — Z438 Encounter for attention to other artificial openings: Secondary | ICD-10-CM | POA: Insufficient documentation

## 2012-07-04 MED ORDER — IOHEXOL 300 MG/ML  SOLN
10.0000 mL | Freq: Once | INTRAMUSCULAR | Status: AC | PRN
  Administered 2012-07-04: 10 mL

## 2012-07-04 NOTE — Procedures (Signed)
Injection of existing appendix abscess drainage catheter demonstrates persistent communication btwn the drain and the appendix/cecum.  Drain not removed.

## 2012-07-16 ENCOUNTER — Telehealth (INDEPENDENT_AMBULATORY_CARE_PROVIDER_SITE_OTHER): Payer: Self-pay

## 2012-07-16 NOTE — Telephone Encounter (Signed)
[facility] nurse called for verbal order to extend pt JP drain care 1 x wk until his f/u appt with Dr. Abbey Chatters on 07/24/12.  Approved.

## 2012-07-24 ENCOUNTER — Ambulatory Visit (INDEPENDENT_AMBULATORY_CARE_PROVIDER_SITE_OTHER): Payer: PRIVATE HEALTH INSURANCE | Admitting: General Surgery

## 2012-07-24 ENCOUNTER — Telehealth (INDEPENDENT_AMBULATORY_CARE_PROVIDER_SITE_OTHER): Payer: Self-pay

## 2012-07-24 VITALS — BP 104/68 | HR 72 | Temp 97.9°F | Resp 18 | Ht 65.0 in | Wt 164.0 lb

## 2012-07-24 DIAGNOSIS — K383 Fistula of appendix: Secondary | ICD-10-CM

## 2012-07-24 DIAGNOSIS — K389 Disease of appendix, unspecified: Secondary | ICD-10-CM

## 2012-07-24 NOTE — Patient Instructions (Signed)
We will call you about the results of your x-ray test and discuss scheduling surgery.  You can try the [facility] or [facility].

## 2012-07-24 NOTE — Progress Notes (Signed)
He is here for another follow up visit after treatment of his perforated appendicitis with abscess. He had a percutaneous drain placed and was treated with this and antibiotics. A drain injection 3 weeks ago however demonstrated persistent communication between the abscess cavity and the appendix. Because of this the drain was left in. He is not having any problems. There is only 5 cc of output per day.  Exam:   Gen.-He looks well and is in no acute distress.  Abdomen-soft, drain is present in the left lower abdomen with straw-colored drainage, no tenderness or mass.    Assessment:  Perforated appendicitis s/p percutaneous drainage with persistent appendiceal leak-clinically, he is doing well.  Plan:  Repeat drainage injection.  If the leak has sealed, the drain will be removed and he will be scheduled for an interval appendectomy, laparoscopic possible open.  If the leak remains, will schedule laparoscopic possible open appendectomy and possible bowel resection.  I have explained the procedure and risks.  Risks include but are not limited to bleeding, infection, wound problems, anesthesia, anastomotic leak, need for colostomy, injury to intraabominal organs.  He and his mother seem to understand and agree to proceed.

## 2012-07-24 NOTE — Telephone Encounter (Signed)
LMOV pt is scheduled for procedure on 07/28/12 at [facility] with 9:45am arrival.

## 2012-07-28 ENCOUNTER — Ambulatory Visit (HOSPITAL_COMMUNITY)
Admission: RE | Admit: 2012-07-28 | Discharge: 2012-07-28 | Disposition: A | Discharge status: Home / Self Care | Payer: PRIVATE HEALTH INSURANCE | Source: Ambulatory Visit | Attending: General Surgery | Admitting: General Surgery

## 2012-07-28 ENCOUNTER — Other Ambulatory Visit (INDEPENDENT_AMBULATORY_CARE_PROVIDER_SITE_OTHER): Payer: Self-pay | Admitting: General Surgery

## 2012-07-28 DIAGNOSIS — K383 Fistula of appendix: Secondary | ICD-10-CM

## 2012-07-28 DIAGNOSIS — K389 Disease of appendix, unspecified: Secondary | ICD-10-CM | POA: Insufficient documentation

## 2012-07-28 MED ORDER — IOHEXOL 300 MG/ML  SOLN
50.0000 mL | Freq: Once | INTRAMUSCULAR | Status: AC | PRN
  Administered 2012-07-28: 10 mL

## 2012-07-29 ENCOUNTER — Telehealth (HOSPITAL_COMMUNITY): Payer: Self-pay

## 2012-07-30 ENCOUNTER — Encounter (INDEPENDENT_AMBULATORY_CARE_PROVIDER_SITE_OTHER): Payer: Self-pay | Admitting: General Surgery

## 2012-07-30 NOTE — Progress Notes (Signed)
Patient ID: Paul Key, male   DOB: Nov 20, 1975, 36 y.o.   MRN: [id number] His repeat drain injection study no longer shows a connection with the appendix and the drain was removed. I discussed proceeding with the appendectomy with him and he is in agreement. We will work on getting as scheduled.

## 2012-08-11 ENCOUNTER — Encounter (HOSPITAL_COMMUNITY): Payer: Self-pay | Admitting: Pharmacy Technician

## 2012-08-18 ENCOUNTER — Ambulatory Visit (HOSPITAL_COMMUNITY)
Admission: RE | Admit: 2012-08-18 | Discharge: 2012-08-18 | Disposition: A | Discharge status: Home / Self Care | Payer: PRIVATE HEALTH INSURANCE | Source: Ambulatory Visit | Attending: General Surgery | Admitting: General Surgery

## 2012-08-18 ENCOUNTER — Encounter (HOSPITAL_COMMUNITY): Payer: Self-pay

## 2012-08-18 ENCOUNTER — Encounter (HOSPITAL_COMMUNITY)
Admission: RE | Admit: 2012-08-18 | Discharge: 2012-08-18 | Disposition: A | Discharge status: Home / Self Care | Payer: PRIVATE HEALTH INSURANCE | Source: Ambulatory Visit | Attending: General Surgery | Admitting: General Surgery

## 2012-08-18 DIAGNOSIS — Z01812 Encounter for preprocedural laboratory examination: Secondary | ICD-10-CM | POA: Insufficient documentation

## 2012-08-18 DIAGNOSIS — Z01818 Encounter for other preprocedural examination: Secondary | ICD-10-CM | POA: Insufficient documentation

## 2012-08-18 DIAGNOSIS — Z01810 Encounter for preprocedural cardiovascular examination: Secondary | ICD-10-CM | POA: Insufficient documentation

## 2012-08-18 LAB — COMPREHENSIVE METABOLIC PANEL
ALT: 11 U/L (ref 0–53)
Albumin: 4.2 g/dL (ref 3.5–5.2)
Alkaline Phosphatase: 51 U/L (ref 39–117)
BUN: 11 mg/dL (ref 6–23)
Chloride: 99 mEq/L (ref 96–112)
GFR calc Af Amer: >90 mL/min (ref >90)
Glucose, Bld: 114 mg/dL — ABNORMAL HIGH (ref 70–99)
Potassium: 4.1 mEq/L (ref 3.5–5.1)
Sodium: 140 mEq/L (ref 135–145)
Total Bilirubin: 0.3 mg/dL (ref 0.3–1.2)

## 2012-08-18 LAB — CBC WITH DIFFERENTIAL/PLATELET
Hemoglobin: 12.7 g/dL — ABNORMAL LOW (ref 13.0–17.0)
Lymphs Abs: 2.1 10*3/uL (ref 0.7–4.0)
Monocytes Relative: 9 % (ref 3–12)
Neutro Abs: 2.4 10*3/uL (ref 1.7–7.7)
Neutrophils Relative %: 47 % (ref 43–77)
RBC: 5.21 MIL/uL (ref 4.22–5.81)
WBC: 5.2 10*3/uL (ref 4.0–10.5)

## 2012-08-18 LAB — PROTIME-INR: Prothrombin Time: 12.4 seconds (ref 11.6–15.2)

## 2012-08-18 NOTE — Progress Notes (Addendum)
Obtained  2 view chest xray due to ekg preliminary result possible acute pericarditis

## 2012-08-18 NOTE — Patient Instructions (Signed)
20 Daiden Coltrane  08/18/2012   Your procedure is scheduled on: 08-21-2012   Report to [facility] at 1000  AM.  Call this number if you have problems the morning of surgery: [phone]   Remember:   Do not eat food or drink liquids:After Midnight.  .  Take these medicines the morning of surgery with A SIP OF WATER: no meds to take   Do not wear jewelry or make up.  Do not wear lotions, powders, or perfumes. You may wear deodorant.    Do not bring valuables to the hospital.  Contacts, dentures or bridgework may not be worn into surgery.  Leave suitcase in the car. After surgery it may be brought to your room.  For patients admitted to the hospital, checkout time is 11:00 AM the day of discharge                             Patients discharged the day of surgery will not be allowed to drive home. If going home same day of surgery, you must have someone stay with you the first 24 hours at home and arrange for some one to drive you home from hospital.    Special Instructions: See [facility] Preparing for Surgery instruction sheet. Women do not shave legs or underarms for 12 hours before showers. Men may shave face morning of surgery.    Please read over the following fact sheets that you were given: MRSA Information  [facility] pre op nurse phone number [phone], call if needed

## 2012-08-19 NOTE — Progress Notes (Signed)
Showed ekg 08-18-12 ekg to dr ewell anesthesia pt ok for surgery

## 2012-08-19 NOTE — Progress Notes (Signed)
Routed 08-08-2012 ekg results to dr Abbey Chatters Shirleen Schirmer

## 2012-08-21 ENCOUNTER — Encounter (HOSPITAL_COMMUNITY): Payer: Self-pay

## 2012-08-21 ENCOUNTER — Encounter (HOSPITAL_COMMUNITY): Payer: Self-pay | Admitting: *Deleted

## 2012-08-21 ENCOUNTER — Encounter (HOSPITAL_COMMUNITY): Payer: Self-pay | Admitting: Anesthesiology

## 2012-08-21 ENCOUNTER — Ambulatory Visit (HOSPITAL_COMMUNITY)
Admission: RE | Admit: 2012-08-21 | Discharge: 2012-08-22 | Disposition: A | Discharge status: Home / Self Care | Payer: PRIVATE HEALTH INSURANCE | Source: Ambulatory Visit | Attending: General Surgery | Admitting: General Surgery

## 2012-08-21 ENCOUNTER — Ambulatory Visit (HOSPITAL_COMMUNITY): Payer: PRIVATE HEALTH INSURANCE | Admitting: Anesthesiology

## 2012-08-21 ENCOUNTER — Encounter (HOSPITAL_COMMUNITY)
Admission: RE | Disposition: A | Discharge status: Home / Self Care | Payer: Self-pay | Source: Ambulatory Visit | Attending: General Surgery

## 2012-08-21 DIAGNOSIS — K3580 Unspecified acute appendicitis: Secondary | ICD-10-CM

## 2012-08-21 DIAGNOSIS — K3533 Acute appendicitis with perforation and localized peritonitis, with abscess: Secondary | ICD-10-CM | POA: Insufficient documentation

## 2012-08-21 DIAGNOSIS — E119 Type 2 diabetes mellitus without complications: Secondary | ICD-10-CM | POA: Insufficient documentation

## 2012-08-21 DIAGNOSIS — E785 Hyperlipidemia, unspecified: Secondary | ICD-10-CM | POA: Insufficient documentation

## 2012-08-21 HISTORY — PX: LAPAROSCOPIC APPENDECTOMY: SHX408

## 2012-08-21 LAB — GLUCOSE, CAPILLARY: Glucose-Capillary: 112 mg/dL — ABNORMAL HIGH (ref 70–99)

## 2012-08-21 SURGERY — APPENDECTOMY, LAPAROSCOPIC
Anesthesia: General | Site: Abdomen | Wound class: Contaminated

## 2012-08-21 MED ORDER — OXYCODONE-ACETAMINOPHEN 5-325 MG PO TABS
1.0000 | ORAL_TABLET | ORAL | Status: DC | PRN
  Administered 2012-08-21 – 2012-08-22 (×2): 2 via ORAL
  Filled 2012-08-21 (×2): qty 2

## 2012-08-21 MED ORDER — ONDANSETRON HCL 4 MG PO TABS: 4.0000 mg | ORAL_TABLET | Freq: Four times a day (QID) | ORAL | Status: DC | PRN

## 2012-08-21 MED ORDER — KCL IN DEXTROSE-NACL 20-5-0.45 MEQ/L-%-% IV SOLN
INTRAVENOUS | Status: DC
  Administered 2012-08-21: 17:00:00 via INTRAVENOUS
  Administered 2012-08-22: 100 mL via INTRAVENOUS
  Filled 2012-08-21 (×3): qty 1000

## 2012-08-21 MED ORDER — 0.9 % SODIUM CHLORIDE (POUR BTL) OPTIME
TOPICAL | Status: DC | PRN
  Administered 2012-08-21: 1000 mL

## 2012-08-21 MED ORDER — OXYCODONE HCL 5 MG/5ML PO SOLN
5.0000 mg | Freq: Once | ORAL | Status: DC | PRN
  Filled 2012-08-21: qty 5

## 2012-08-21 MED ORDER — INFLUENZA VIRUS VACC SPLIT PF IM SUSP
0.5000 mL | INTRAMUSCULAR | Status: AC
  Administered 2012-08-22: 0.5 mL via INTRAMUSCULAR
  Filled 2012-08-21: qty 0.5

## 2012-08-21 MED ORDER — PROMETHAZINE HCL 25 MG/ML IJ SOLN: 6.2500 mg | INTRAMUSCULAR | Status: DC | PRN

## 2012-08-21 MED ORDER — HYDROMORPHONE HCL PF 1 MG/ML IJ SOLN
0.2500 mg | INTRAMUSCULAR | Status: DC | PRN
  Administered 2012-08-21 (×2): 0.5 mg via INTRAVENOUS

## 2012-08-21 MED ORDER — ACETAMINOPHEN 10 MG/ML IV SOLN: 1000.0000 mg | Freq: Once | INTRAVENOUS | Status: DC | PRN

## 2012-08-21 MED ORDER — ONDANSETRON HCL 4 MG/2ML IJ SOLN
INTRAMUSCULAR | Status: DC | PRN
  Administered 2012-08-21: 4 mg via INTRAVENOUS

## 2012-08-21 MED ORDER — CISATRACURIUM BESYLATE (PF) 10 MG/5ML IV SOLN
INTRAVENOUS | Status: DC | PRN
  Administered 2012-08-21: 6 mg via INTRAVENOUS

## 2012-08-21 MED ORDER — BUPIVACAINE HCL (PF) 0.5 % IJ SOLN
INTRAMUSCULAR | Status: DC | PRN
  Administered 2012-08-21: 20 mL

## 2012-08-21 MED ORDER — HYDROMORPHONE HCL PF 1 MG/ML IJ SOLN
INTRAMUSCULAR | Status: AC
  Filled 2012-08-21: qty 1

## 2012-08-21 MED ORDER — OXYCODONE HCL 5 MG PO TABS: 5.0000 mg | ORAL_TABLET | Freq: Once | ORAL | Status: DC | PRN

## 2012-08-21 MED ORDER — GLYCOPYRROLATE 0.2 MG/ML IJ SOLN
INTRAMUSCULAR | Status: DC | PRN
  Administered 2012-08-21: .5 mg via INTRAVENOUS

## 2012-08-21 MED ORDER — SUFENTANIL CITRATE 50 MCG/ML IV SOLN
INTRAVENOUS | Status: DC | PRN
  Administered 2012-08-21: 20 ug via INTRAVENOUS
  Administered 2012-08-21: 10 ug via INTRAVENOUS

## 2012-08-21 MED ORDER — LACTATED RINGERS IR SOLN
Status: DC | PRN
  Administered 2012-08-21: 3000 mL

## 2012-08-21 MED ORDER — MIDAZOLAM HCL 5 MG/5ML IJ SOLN
INTRAMUSCULAR | Status: DC | PRN
  Administered 2012-08-21: 2 mg via INTRAVENOUS

## 2012-08-21 MED ORDER — INSULIN ASPART 100 UNIT/ML ~~LOC~~ SOLN: 0.0000 [IU] | Freq: Every day | SUBCUTANEOUS | Status: DC

## 2012-08-21 MED ORDER — PROPOFOL 10 MG/ML IV BOLUS
INTRAVENOUS | Status: DC | PRN
  Administered 2012-08-21: 200 mg via INTRAVENOUS

## 2012-08-21 MED ORDER — SUCCINYLCHOLINE CHLORIDE 20 MG/ML IJ SOLN
INTRAMUSCULAR | Status: DC | PRN
  Administered 2012-08-21: 100 mg via INTRAVENOUS

## 2012-08-21 MED ORDER — KCL IN DEXTROSE-NACL 20-5-0.45 MEQ/L-%-% IV SOLN
INTRAVENOUS | Status: AC
  Filled 2012-08-21: qty 1000

## 2012-08-21 MED ORDER — PNEUMOCOCCAL VAC POLYVALENT 25 MCG/0.5ML IJ INJ
0.5000 mL | INJECTION | INTRAMUSCULAR | Status: AC
  Administered 2012-08-22: 0.5 mL via INTRAMUSCULAR
  Filled 2012-08-21: qty 0.5

## 2012-08-21 MED ORDER — ONDANSETRON HCL 4 MG/2ML IJ SOLN: 4.0000 mg | Freq: Four times a day (QID) | INTRAMUSCULAR | Status: DC | PRN

## 2012-08-21 MED ORDER — ACETAMINOPHEN 10 MG/ML IV SOLN
INTRAVENOUS | Status: AC
  Filled 2012-08-21: qty 100

## 2012-08-21 MED ORDER — EPHEDRINE SULFATE 50 MG/ML IJ SOLN
INTRAMUSCULAR | Status: DC | PRN
  Administered 2012-08-21: 10 mg via INTRAVENOUS

## 2012-08-21 MED ORDER — CEFOXITIN SODIUM-DEXTROSE 1-4 GM-% IV SOLR (PREMIX)
INTRAVENOUS | Status: AC
  Filled 2012-08-21: qty 100

## 2012-08-21 MED ORDER — INSULIN ASPART 100 UNIT/ML ~~LOC~~ SOLN
0.0000 [IU] | Freq: Three times a day (TID) | SUBCUTANEOUS | Status: DC
  Administered 2012-08-22: 3 [IU] via SUBCUTANEOUS

## 2012-08-21 MED ORDER — NEOSTIGMINE METHYLSULFATE 1 MG/ML IJ SOLN
INTRAMUSCULAR | Status: DC | PRN
  Administered 2012-08-21: 4 mg via INTRAVENOUS

## 2012-08-21 MED ORDER — LIDOCAINE HCL (CARDIAC) 20 MG/ML IV SOLN
INTRAVENOUS | Status: DC | PRN
  Administered 2012-08-21: 100 mg via INTRAVENOUS

## 2012-08-21 MED ORDER — MEPERIDINE HCL 50 MG/ML IJ SOLN: 6.2500 mg | INTRAMUSCULAR | Status: DC | PRN

## 2012-08-21 MED ORDER — DEXTROSE 5 % IV SOLN
2.0000 g | INTRAVENOUS | Status: AC
  Administered 2012-08-21: 2 g via INTRAVENOUS
  Filled 2012-08-21: qty 2

## 2012-08-21 MED ORDER — MORPHINE SULFATE 2 MG/ML IJ SOLN: 2.0000 mg | INTRAMUSCULAR | Status: DC | PRN

## 2012-08-21 MED ORDER — LACTATED RINGERS IV SOLN
INTRAVENOUS | Status: DC
  Administered 2012-08-21: 1000 mL via INTRAVENOUS

## 2012-08-21 MED ORDER — DEXTROSE 5 % IV SOLN
1.0000 g | Freq: Four times a day (QID) | INTRAVENOUS | Status: AC
  Administered 2012-08-21 – 2012-08-22 (×3): 1 g via INTRAVENOUS
  Filled 2012-08-21 (×3): qty 1

## 2012-08-21 MED ORDER — BUPIVACAINE HCL 0.5 % IJ SOLN
INTRAMUSCULAR | Status: AC
  Filled 2012-08-21: qty 1

## 2012-08-21 MED ORDER — ACETAMINOPHEN 10 MG/ML IV SOLN
INTRAVENOUS | Status: DC | PRN
  Administered 2012-08-21: 1000 mg via INTRAVENOUS

## 2012-08-21 SURGICAL SUPPLY — 50 items
APL SKNCLS STERI-STRIP NONHPOA (GAUZE/BANDAGES/DRESSINGS) ×1
APPLIER CLIP 5 13 M/L LIGAMAX5 (MISCELLANEOUS)
APPLIER CLIP ROT 10 11.4 M/L (STAPLE) ×2
APR CLP MED LRG 11.4X10 (STAPLE) ×1
APR CLP MED LRG 5 ANG JAW (MISCELLANEOUS)
BAG SPEC RTRVL LRG 6X4 10 (ENDOMECHANICALS) ×1
BENZOIN TINCTURE PRP APPL 2/3 (GAUZE/BANDAGES/DRESSINGS) ×2 IMPLANT
CANISTER SUCTION 2500CC (MISCELLANEOUS) ×2 IMPLANT
CHLORAPREP W/TINT 26ML (MISCELLANEOUS) ×2 IMPLANT
CLIP APPLIE 5 13 M/L LIGAMAX5 (MISCELLANEOUS) IMPLANT
CLIP APPLIE ROT 10 11.4 M/L (STAPLE) IMPLANT
CLOTH BEACON ORANGE TIMEOUT ST (SAFETY) ×2 IMPLANT
CLSR STERI-STRIP ANTIMIC 1/2X4 (GAUZE/BANDAGES/DRESSINGS) ×1 IMPLANT
CUTTER FLEX LINEAR 45M (STAPLE) ×2 IMPLANT
DECANTER SPIKE VIAL GLASS SM (MISCELLANEOUS) ×2 IMPLANT
DEVICE TROCAR PUNCTURE CLOSURE (ENDOMECHANICALS) ×1 IMPLANT
DRAPE LAPAROSCOPIC ABDOMINAL (DRAPES) ×2 IMPLANT
DRAPE UTILITY XL STRL (DRAPES) ×2 IMPLANT
DRSG TEGADERM 2-3/8X2-3/4 SM (GAUZE/BANDAGES/DRESSINGS) ×2 IMPLANT
ELECT REM PT RETURN 9FT ADLT (ELECTROSURGICAL) ×2
ELECTRODE REM PT RTRN 9FT ADLT (ELECTROSURGICAL) ×1 IMPLANT
ENDOLOOP SUT PDS II  0 18 (SUTURE)
ENDOLOOP SUT PDS II 0 18 (SUTURE) IMPLANT
GAUZE SPONGE 2X2 8PLY STRL LF (GAUZE/BANDAGES/DRESSINGS) IMPLANT
GLOVE BIOGEL PI IND STRL 7.0 (GLOVE) ×1 IMPLANT
GLOVE BIOGEL PI INDICATOR 7.0 (GLOVE) ×1
GLOVE ECLIPSE 8.0 STRL XLNG CF (GLOVE) ×2 IMPLANT
GLOVE INDICATOR 8.0 STRL GRN (GLOVE) ×4 IMPLANT
GOWN STRL NON-REIN LRG LVL3 (GOWN DISPOSABLE) ×2 IMPLANT
GOWN STRL REIN XL XLG (GOWN DISPOSABLE) ×4 IMPLANT
KIT BASIN OR (CUSTOM PROCEDURE TRAY) ×2 IMPLANT
PENCIL BUTTON HOLSTER BLD 10FT (ELECTRODE) ×2 IMPLANT
POUCH SPECIMEN RETRIEVAL 10MM (ENDOMECHANICALS) ×1 IMPLANT
RELOAD 45 VASCULAR/THIN (ENDOMECHANICALS) IMPLANT
RELOAD STAPLE 45 2.5 WHT GRN (ENDOMECHANICALS) IMPLANT
RELOAD STAPLE 45 3.5 BLU ETS (ENDOMECHANICALS) IMPLANT
RELOAD STAPLE TA45 3.5 REG BLU (ENDOMECHANICALS) ×2 IMPLANT
SCALPEL HARMONIC ACE (MISCELLANEOUS) ×1 IMPLANT
SET IRRIG TUBING LAPAROSCOPIC (IRRIGATION / IRRIGATOR) ×2 IMPLANT
SOLUTION ANTI FOG 6CC (MISCELLANEOUS) ×2 IMPLANT
SPONGE GAUZE 2X2 STER 10/PKG (GAUZE/BANDAGES/DRESSINGS) ×1
STRIP CLOSURE SKIN 1/2X4 (GAUZE/BANDAGES/DRESSINGS) ×2 IMPLANT
SUT MNCRL AB 4-0 PS2 18 (SUTURE) ×2 IMPLANT
TOWEL OR 17X26 10 PK STRL BLUE (TOWEL DISPOSABLE) ×3 IMPLANT
TOWEL OR NON WOVEN STRL DISP B (DISPOSABLE) ×1 IMPLANT
TRAY FOLEY CATH 14FRSI W/METER (CATHETERS) ×2 IMPLANT
TRAY LAP CHOLE (CUSTOM PROCEDURE TRAY) ×2 IMPLANT
TROCAR BLADELESS OPT 5 75 (ENDOMECHANICALS) ×4 IMPLANT
TROCAR XCEL BLUNT TIP 100MML (ENDOMECHANICALS) ×2 IMPLANT
TUBING INSUFFLATION 10FT LAP (TUBING) ×2 IMPLANT

## 2012-08-21 NOTE — Anesthesia Procedure Notes (Signed)
Procedure Name: Intubation Date/Time: 08/21/2012 2:41 PM Performed by: Leroy Libman L Patient Re-evaluated:Patient Re-evaluated prior to inductionOxygen Delivery Method: Circle system utilized Preoxygenation: Pre-oxygenation with 100% oxygen Intubation Type: IV induction, Rapid sequence and Cricoid Pressure applied Laryngoscope Size: Miller and 3 Grade View: Grade I Tube size: 7.5 mm Number of attempts: 1 Airway Equipment and Method: Stylet Placement Confirmation: ETT inserted through vocal cords under direct vision,  breath sounds checked- equal and bilateral and positive ETCO2 Secured at: 21 cm Tube secured with: Tape Dental Injury: Teeth and Oropharynx as per pre-operative assessment

## 2012-08-21 NOTE — Op Note (Signed)
Operative Note  Paul Key male 36 y.o. 08/21/2012  PREOPERATIVE DX:  Acute appendicitis with abscess status post percutaneous drainage  POSTOPERATIVE DX:  Same  PROCEDURE:  Laparoscopic interval appendectomy         Surgeon: Adolph Pollack   Assistants: None  Anesthesia: General endotracheal anesthesia  Indications: This is a 36 year old male who was admitted earlier this year with acute appendicitis with an abscess it was drained percutaneously. He is treated with antibiotics. The drain was eventually able to be removed and now he presents for interval appendectomy. The procedure, risks, and after care were discussed with him and his mother preoperatively.    Procedure Detail:  He was brought to the operating room placed supine on the operating table and a general anesthetic was given. The hair on the abdominal wall was clipped. The abdominal wall was widely sterilely prepped and draped.  He was placed in a slight reverse Trendelenburg position. A 5 mm incision was made in the right subcostal area. Using a 5 mm Optiview trocar and laparoscope access was gained to the peritoneal cavity and a pneumoperitoneum was created. Inspection of the area under the trocar demonstrated no evidence of bleeding organ injury. Examination of the abdominal cavity demonstrated multiple thin adhesions between the omentum and anterior abdominal wall and the cecum and anterior abdominal wall.  A 5 mm trocar was placed in the left lower quadrant and using sharp dissection these adhesions were divided and the omentum was mobilized as well as the cecum. The appendix was identified and the distal appendix was noted to be surrounded by omentum. A 12 mm trocar was placed in the subumbilical position. The mesoappendix was divided with the harmonic scalpel down to the base of the cecum. Using the harmonic scalpel I resected some omentum around the tip of the appendix to be occluded in the specimen. The appendix  was then amputated off the cecum with a small cuff of cecum using the Endo GIA stapler. The appendix was then placed in the retrieval bag and removed through the 12 mm trocar site.  The trocar was replaced. The area was copiously irrigated with saline. The staple line was solid without evidence of leak or bleeding. 4 quadrant inspection demonstrated no evidence of bleeding organ injury.  The 12 mm trocar was removed and the fascial defect was closed with interrupted 0 Vicryl sutures by way of an Endo Close device. The remaining trocars were removed and the pneumoperitoneum was released.  Skin incisions were closed with 4-0 Monocryl subcuticular stitches. Steri-Strips and sterile dressings were applied.  He tolerated the procedure well without any apparent complications and was taken to the recovery room in satisfactory condition.   Estimated Blood Loss:  less than 100 mL         Drains: none  Blood Given: none          Specimens: Appendix and small amount of omentum        Complications:  * No complications entered in OR log *         Disposition: PACU - hemodynamically stable.         Condition: stable

## 2012-08-21 NOTE — H&P (Signed)
Paul Key is an 36 y.o. male.   Chief Complaint:   Here for elective appendectomy. HPI:   His is s/p percutaneous drainage for acute appendicitis with abscess in the past.  He now presents for interval appendectomy.  He had a fistula that closed.  He had a small breakfast this AM thus his operation had been delayed.  Past Medical History  Diagnosis Date  . Diabetes mellitus   . Hyperlipidemia   . Appendicitis with abscess     Past Surgical History  Procedure Date  . Hernia repair yrs ago    History reviewed. No pertinent family history. Social History:  reports that he has never smoked. He has never used smokeless tobacco. He reports that he does not drink alcohol or use illicit drugs.  Allergies: No Known Allergies  Medications Prior to Admission  Medication Sig Dispense Refill  . metFORMIN (GLUCOPHAGE) 500 MG tablet Take 500 mg by mouth every morning.       . pravastatin (PRAVACHOL) 40 MG tablet Take 40 mg by mouth at bedtime.         No results found for this or any previous visit (from the past 48 hour(s)). No results found.  Review of Systems  Constitutional: Negative for fever and chills.  HENT: Negative for sore throat.   Gastrointestinal: Negative for nausea, vomiting and diarrhea.    Blood pressure 111/75, pulse 73, temperature 97.7 F (36.5 C), temperature source Oral, resp. rate 18, SpO2 100.00%. Physical Exam  Constitutional: He appears well-developed and well-nourished. No distress.  HENT:  Head: Normocephalic and atraumatic.  Neck: Neck supple.  Cardiovascular: Normal rate and regular rhythm.   Respiratory: Effort normal and breath sounds normal.  GI: Soft. He exhibits no mass. There is no tenderness.  Musculoskeletal: He exhibits no edema.  Lymphadenopathy:    He has no cervical adenopathy.  Skin: Skin is warm and dry.     Assessment/Plan Acute appendicitis with abscess-resolved.  Plan:  Interval laparoscopic, possible open  appendectomy.  Emoree Sasaki J 08/21/2012, 2:23 PM

## 2012-08-21 NOTE — Transfer of Care (Signed)
Immediate Anesthesia Transfer of Care Note  Patient: Paul Key  Procedure(s) Performed: Procedure(s) (LRB) with comments: APPENDECTOMY LAPAROSCOPIC (N/A)  Patient Location: PACU  Anesthesia Type: General  Level of Consciousness: awake, alert  and patient cooperative  Airway & Oxygen Therapy: Patient Spontanous Breathing and Patient connected to face mask oxygen  Post-op Assessment: Report given to PACU RN and Post -op Vital signs reviewed and stable  Post vital signs: Reviewed and stable  Complications: No apparent anesthesia complications

## 2012-08-21 NOTE — Anesthesia Preprocedure Evaluation (Addendum)
Anesthesia Evaluation  Patient identified by MRN, date of birth, ID band Patient awake    Reviewed: Allergy & Precautions, H&P , NPO status , Patient's Chart, lab work & pertinent test results  Airway Mallampati: III TM Distance: >3 FB Neck ROM: Full    Dental  (+) Teeth Intact and Dental Advisory Given   Pulmonary neg pulmonary ROS,  breath sounds clear to auscultation  Pulmonary exam normal       Cardiovascular Exercise Tolerance: Good negative cardio ROS  Rhythm:Regular Rate:Normal     Neuro/Psych negative neurological ROS  negative psych ROS   GI/Hepatic negative GI ROS, Neg liver ROS,   Endo/Other  diabetes, Type 2, Oral Hypoglycemic Agents  Renal/GU negative Renal ROS     Musculoskeletal   Abdominal   Peds  Hematology negative hematology ROS (+)   Anesthesia Other Findings   Reproductive/Obstetrics                          Anesthesia Physical Anesthesia Plan  ASA: II  Anesthesia Plan: General   Post-op Pain Management:    Induction: Intravenous and Rapid sequence  Airway Management Planned: Oral ETT  Additional Equipment:   Intra-op Plan:   Post-operative Plan: Extubation in OR  Informed Consent: I have reviewed the patients History and Physical, chart, labs and discussed the procedure including the risks, benefits and alternatives for the proposed anesthesia with the patient or authorized representative who has indicated his/her understanding and acceptance.   Dental advisory given  Plan Discussed with: CRNA  Anesthesia Plan Comments:        Anesthesia Quick Evaluation

## 2012-08-21 NOTE — Interval H&P Note (Signed)
History and Physical Interval Note:  08/21/2012 2:27 PM  Paul Key  has presented today for surgery, with the diagnosis of appendicitis with abscess  The various methods of treatment have been discussed with the patient and family. After consideration of risks, benefits and other options for treatment, the patient has consented to  Procedure(s) (LRB) with comments: APPENDECTOMY LAPAROSCOPIC (N/A) - Laparoscopic Possible Open Appendectomy as a surgical intervention .  The patient's history has been reviewed, patient examined, no change in status, stable for surgery.  I have reviewed the patient's chart and labs.  Questions were answered to the patient's satisfaction.     [name]

## 2012-08-21 NOTE — Anesthesia Postprocedure Evaluation (Signed)
Anesthesia Post Note  Patient: Paul Key  Procedure(s) Performed: Procedure(s) (LRB): APPENDECTOMY LAPAROSCOPIC (N/A)  Anesthesia type: General  Patient location: PACU  Post pain: Pain level controlled  Post assessment: Post-op Vital signs reviewed  Last Vitals: BP 124/70  Pulse 67  Temp 36.8 C (Oral)  Resp 18  SpO2 100%  Post vital signs: Reviewed  Level of consciousness: sedated  Complications: No apparent anesthesia complications

## 2012-08-21 NOTE — Progress Notes (Signed)
Anesthesia and Dr. Abbey Chatters notified that patient states he ate 3/4cup Raisin Bran and took Metformin this am at 8am. Jeanice Lim called back from OR to inform nurse and patient that surgery is for 14:30 .

## 2012-08-22 ENCOUNTER — Encounter (HOSPITAL_COMMUNITY): Payer: Self-pay | Admitting: General Surgery

## 2012-08-22 MED ORDER — OXYCODONE-ACETAMINOPHEN 5-325 MG PO TABS: 1.0000 | ORAL_TABLET | ORAL | Status: DC | PRN

## 2012-08-22 NOTE — Discharge Summary (Signed)
Physician Discharge Summary  Patient ID: Paul Key MRN: [id number] DOB/AGE: [birth year] 36 y.o.  Admit date: 08/21/2012 Discharge date: 08/22/2012  Admission Diagnoses: Acute appendicitis with abscess  Discharge Diagnoses: Acute appendicitis with abscess    Discharged Condition: good  Hospital Course: He underwent interval laparoscopic appendectomy and did well.  He was able to be discharged on POD#1. Discharge instructions were given to him.  Consults: None  Significant Diagnostic Studies: none  Treatments: surgery: Interval laparoscopic appendectomy  Discharge Exam: Blood pressure 100/68, pulse 71, temperature 98.1 F (36.7 C), temperature source Oral, resp. rate 18, height 5\' 6"  (1.676 m), weight 169 lb 3.2 oz (76.749 kg), SpO2 100.00%.   Disposition: 01-Home or Self Care     Medication List     As of 08/22/2012  9:51 AM    TAKE these medications         metFORMIN 500 MG tablet   Commonly known as: GLUCOPHAGE   Take 500 mg by mouth every morning.      oxyCODONE-acetaminophen 5-325 MG per tablet   Commonly known as: PERCOCET/ROXICET   Take 1-2 tablets by mouth every 4 (four) hours as needed.      pravastatin 40 MG tablet   Commonly known as: PRAVACHOL   Take 40 mg by mouth at bedtime.         Signed: Adolph Pollack 08/22/2012, 9:51 AM

## 2012-08-22 NOTE — Progress Notes (Signed)
1 Day Post-Op  Subjective: Feels good this AM.  Ate his breakfast.  Objective: Vital signs in last 24 hours: Temp:  [97.5 F (36.4 C)-98.3 F (36.8 C)] 98.1 F (36.7 C) (10/04 0551) Pulse Rate:  [58-75] 71  (10/04 0551) Resp:  [11-18] 18  (10/04 0551) BP: (100-127)/(61-78) 100/68 mmHg (10/04 0551) SpO2:  [100 %] 100 % (10/04 0551) Weight:  [169 lb 3.2 oz (76.749 kg)] 169 lb 3.2 oz (76.749 kg) (10/03 1730) Last BM Date: 08/22/12  Intake/Output from previous day: 10/03 0701 - 10/04 0700 In: 1560 [P.O.:480; I.V.:1080] Out: 1750 [Urine:1700; Blood:50] Intake/Output this shift:    PE: Abd-soft, dressings dry  Lab Results:  No results found for this basename: WBC:2,HGB:2,HCT:2,PLT:2 in the last 72 hours BMET No results found for this basename: NA:2,K:2,CL:2,CO2:2,GLUCOSE:2,BUN:2,CREATININE:2,CALCIUM:2 in the last 72 hours PT/INR No results found for this basename: LABPROT:2,INR:2 in the last 72 hours Comprehensive Metabolic Panel:    Component Value Date/Time   NA 140 08/18/2012 1430   K 4.1 08/18/2012 1430   CL 99 08/18/2012 1430   CO2 31 08/18/2012 1430   BUN 11 08/18/2012 1430   CREATININE 0.99 08/18/2012 1430   GLUCOSE 114* 08/18/2012 1430   CALCIUM 10.7* 08/18/2012 1430   AST 18 08/18/2012 1430   ALT 11 08/18/2012 1430   ALKPHOS 51 08/18/2012 1430   BILITOT 0.3 08/18/2012 1430   PROT 8.1 08/18/2012 1430   ALBUMIN 4.2 08/18/2012 1430     Studies/Results: No results found.  Anti-infectives: Anti-infectives     Start     Dose/Rate Route Frequency Ordered Stop   08/21/12 2100   cefOXitin (MEFOXIN) 1 g in dextrose 5 % 50 mL IVPB        1 g 100 mL/hr over 30 Minutes Intravenous Every 6 hours 08/21/12 1739 08/22/12 0808   08/21/12 1014   cefOXitin (MEFOXIN) 2 g in dextrose 5 % 50 mL IVPB        2 g 100 mL/hr over 30 Minutes Intravenous 60 min pre-op 08/21/12 1014 08/21/12 1445          Assessment Active Problems: Post lap appendectomy-doing well    LOS: 1 day    Plan: Discharge.  Instructions given.   Paul Key J 08/22/2012

## 2012-09-03 ENCOUNTER — Ambulatory Visit (INDEPENDENT_AMBULATORY_CARE_PROVIDER_SITE_OTHER): Payer: PRIVATE HEALTH INSURANCE | Admitting: General Surgery

## 2012-09-03 ENCOUNTER — Encounter (INDEPENDENT_AMBULATORY_CARE_PROVIDER_SITE_OTHER): Payer: Self-pay | Admitting: General Surgery

## 2012-09-03 VITALS — BP 122/84 | HR 64 | Temp 97.2°F | Resp 14 | Ht 67.0 in | Wt 162.0 lb

## 2012-09-03 DIAGNOSIS — Z9889 Other specified postprocedural states: Secondary | ICD-10-CM

## 2012-09-03 NOTE — Patient Instructions (Signed)
Activities as tolerated.  Call us if you have any problems with your incisions.

## 2012-09-03 NOTE — Progress Notes (Signed)
Procedure:  Laparoscopic appendectomy  Date:  08/21/2012  Pathology:  Appendicitis  History:  He is here for his first postoperative visit and is doing well. He is eating well and his bowels are moving. His pathology came back consistent with appendicitis and I explained this to him and gave him a copy of this.  Exam: General- Is in NAD.  Abd-Soft, incisions are clean and intact with Steri-Strips.  Assessment:  Doing well post appendectomy.  Plan:  Activities as tolerated. Return visit as needed.

## 2013-05-08 NOTE — Telephone Encounter (Signed)
e

## 2013-06-11 ENCOUNTER — Emergency Department (HOSPITAL_COMMUNITY)
Admission: EM | Admit: 2013-06-11 | Discharge: 2013-06-11 | Disposition: A | Discharge status: Home / Self Care | Payer: No Typology Code available for payment source | Attending: Emergency Medicine | Admitting: Emergency Medicine

## 2013-06-11 ENCOUNTER — Encounter (HOSPITAL_COMMUNITY): Payer: Self-pay | Admitting: *Deleted

## 2013-06-11 DIAGNOSIS — Z79899 Other long term (current) drug therapy: Secondary | ICD-10-CM | POA: Insufficient documentation

## 2013-06-11 DIAGNOSIS — Z8719 Personal history of other diseases of the digestive system: Secondary | ICD-10-CM | POA: Insufficient documentation

## 2013-06-11 DIAGNOSIS — E785 Hyperlipidemia, unspecified: Secondary | ICD-10-CM | POA: Insufficient documentation

## 2013-06-11 DIAGNOSIS — Z87828 Personal history of other (healed) physical injury and trauma: Secondary | ICD-10-CM | POA: Insufficient documentation

## 2013-06-11 DIAGNOSIS — E119 Type 2 diabetes mellitus without complications: Secondary | ICD-10-CM | POA: Insufficient documentation

## 2013-06-11 DIAGNOSIS — M79609 Pain in unspecified limb: Secondary | ICD-10-CM | POA: Insufficient documentation

## 2013-06-11 DIAGNOSIS — M549 Dorsalgia, unspecified: Secondary | ICD-10-CM | POA: Insufficient documentation

## 2013-06-11 MED ORDER — IBUPROFEN 800 MG PO TABS: 800.0000 mg | ORAL_TABLET | Freq: Three times a day (TID) | ORAL | Status: DC

## 2013-06-11 MED ORDER — CYCLOBENZAPRINE HCL 10 MG PO TABS: 10.0000 mg | ORAL_TABLET | Freq: Two times a day (BID) | ORAL | Status: DC | PRN

## 2013-06-11 NOTE — ED Notes (Signed)
Pt ambulatory to exam room with steady gait.  
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
## 2013-06-11 NOTE — ED Provider Notes (Signed)
Medical screening examination/treatment/procedure(s) were performed by non-physician practitioner and as supervising physician I was immediately available for consultation/collaboration.  Flint Melter, MD 06/11/13 [phone]
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
## 2013-12-12 IMAGING — CT CT ABCESS DRAINAGE
1 series · 1 of 18 positions shown · non-contrast
Comparison: none

CT GUIDED ABSCESS DRAIN

Date: 06/03/12
CLINICAL HISTORY: 35-year-old male with a history of a perforated
appendicitis and periappendiceal abscess.  Presents for CT guided
drain placement.

[Series 2: localizer · axial · 5.0mm · 0.74mm/px · 1 of 18 slices shown]
[im 10/18]
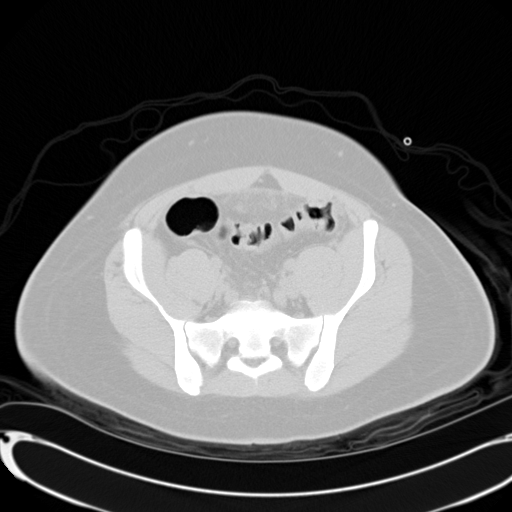

[1 of 18 positions shown; findings below may reference images not displayed]

Procedures Performed:
1. CT guided intra-abdominal drain placement

Sedation: Moderate (conscious) sedation was used.  4 mg Versed, 100
mcg Fentanyl were administered intravenously.  The patient's vital
signs were monitored continuously by radiology nursing throughout
the procedure.

Sedation Time: 28 minutes

PROCEDURE/FINDINGS:

 Informed consent was obtained from the patient following
explanation of the procedure, risks, benefits and alternatives.
The patient understands, agrees and consents for the procedure.
All questions were addressed.  A time out was performed.

Maximal barrier sterile technique utilized including caps, mask,
sterile gowns, sterile gloves, large sterile drape, hand hygiene,
and skin prep with chlorhexidine.

It planning axial CT scan was performed confirming the location of
the 2.6 x 2.1 cm abscess at the appendiceal tip immediately
adjacent to an appendicolith.  There is significant inflammatory
stranding surrounding the abscess collection.  An appropriate skin
entry site was selected and marked.  Local anesthesia was then
obtained with 1% lidocaine.

An 18 gauge single wall needle was then advanced under direct CT
guidance through the skin, soft tissues, rectus abdominal muscle,
peritoneal lining and into the periappendiceal abscess collection.
Location of the needle tip was confirmed with axial CT imaging.  A
short Amplatz wire was advanced through the needle and coiled
within the abscess cavity.  The tract was then serially dilated and
a 10.2 Daniele Breda drainage catheter positioned under CT
guidance.  Approximately 5 ml of grossly feculent material was
aspirated.  This sample will be sent for culture including
anaerobic culture.

Axial CT imaging confirmed placement of the pigtail catheter within
the abscess cavity and the successful aspiration of the gas and
fluid material.  The catheter was secured to the skin with both
Prolene and connected to bulb suction.

The patient tolerated the procedure well, there is no immediate
complication.  The patient was returned to the floor in good
condition.
IMPRESSION: 1.  Successful placement of a 10.2 French drainage catheter into
the periappendiceal abscess collection. Grossly feculent material
was aspirated and sent for culture.

2.  Please note that on CT imaging there is non loculated fluid
within the pelvis in the rectovesicular recess.  This likely
represents reactive peritoneal fluid, however infection is
difficult to exclude completely.  If the patient does not respond
clinically to intravenous antibiotics and percutaneous drainage of
the periappendiceal abscess, consider repeat imaging to assess for
development of a deep pelvic abscess.  If this occurs, a
transgluteal drain could likely be placed.

[REDACTED]

## 2014-12-09 ENCOUNTER — Emergency Department (HOSPITAL_COMMUNITY): Payer: Medicare Other

## 2014-12-09 ENCOUNTER — Encounter (HOSPITAL_COMMUNITY): Payer: Self-pay | Admitting: Emergency Medicine

## 2014-12-09 ENCOUNTER — Emergency Department (HOSPITAL_COMMUNITY)
Admission: EM | Admit: 2014-12-09 | Discharge: 2014-12-09 | Disposition: A | Discharge status: Home / Self Care | Payer: Medicare Other | Attending: Emergency Medicine | Admitting: Emergency Medicine

## 2014-12-09 DIAGNOSIS — Y9389 Activity, other specified: Secondary | ICD-10-CM | POA: Diagnosis not present

## 2014-12-09 DIAGNOSIS — Y998 Other external cause status: Secondary | ICD-10-CM | POA: Diagnosis not present

## 2014-12-09 DIAGNOSIS — Z8719 Personal history of other diseases of the digestive system: Secondary | ICD-10-CM | POA: Insufficient documentation

## 2014-12-09 DIAGNOSIS — S79912A Unspecified injury of left hip, initial encounter: Secondary | ICD-10-CM | POA: Insufficient documentation

## 2014-12-09 DIAGNOSIS — Y92410 Unspecified street and highway as the place of occurrence of the external cause: Secondary | ICD-10-CM | POA: Diagnosis not present

## 2014-12-09 DIAGNOSIS — Z79899 Other long term (current) drug therapy: Secondary | ICD-10-CM | POA: Diagnosis not present

## 2014-12-09 DIAGNOSIS — E785 Hyperlipidemia, unspecified: Secondary | ICD-10-CM | POA: Insufficient documentation

## 2014-12-09 DIAGNOSIS — E119 Type 2 diabetes mellitus without complications: Secondary | ICD-10-CM | POA: Insufficient documentation

## 2014-12-09 DIAGNOSIS — M25552 Pain in left hip: Secondary | ICD-10-CM

## 2014-12-09 DIAGNOSIS — S3992XA Unspecified injury of lower back, initial encounter: Secondary | ICD-10-CM | POA: Diagnosis present

## 2014-12-09 DIAGNOSIS — S39012A Strain of muscle, fascia and tendon of lower back, initial encounter: Secondary | ICD-10-CM | POA: Insufficient documentation

## 2014-12-09 MED ORDER — IBUPROFEN 800 MG PO TABS: 800.0000 mg | ORAL_TABLET | Freq: Three times a day (TID) | ORAL | Status: DC

## 2014-12-09 MED ORDER — CYCLOBENZAPRINE HCL 10 MG PO TABS: 10.0000 mg | ORAL_TABLET | Freq: Two times a day (BID) | ORAL | Status: DC | PRN

## 2014-12-09 MED ORDER — IBUPROFEN 400 MG PO TABS
800.0000 mg | ORAL_TABLET | Freq: Once | ORAL | Status: AC
Start: 2014-12-09 — End: 2014-12-09
  Administered 2014-12-09: 800 mg via ORAL
  Filled 2014-12-09: qty 2

## 2014-12-09 NOTE — ED Notes (Signed)
Pt transported to Xray. 
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
## 2016-03-20 ENCOUNTER — Emergency Department (HOSPITAL_COMMUNITY)
Admission: EM | Admit: 2016-03-20 | Discharge: 2016-03-20 | Disposition: A | Discharge status: Home / Self Care | Payer: Medicare Other | Attending: Emergency Medicine | Admitting: Emergency Medicine

## 2016-03-20 ENCOUNTER — Encounter (HOSPITAL_COMMUNITY): Payer: Self-pay

## 2016-03-20 DIAGNOSIS — Z8719 Personal history of other diseases of the digestive system: Secondary | ICD-10-CM | POA: Diagnosis not present

## 2016-03-20 DIAGNOSIS — R55 Syncope and collapse: Secondary | ICD-10-CM | POA: Diagnosis not present

## 2016-03-20 DIAGNOSIS — Z791 Long term (current) use of non-steroidal anti-inflammatories (NSAID): Secondary | ICD-10-CM | POA: Insufficient documentation

## 2016-03-20 DIAGNOSIS — Z7984 Long term (current) use of oral hypoglycemic drugs: Secondary | ICD-10-CM | POA: Insufficient documentation

## 2016-03-20 DIAGNOSIS — E785 Hyperlipidemia, unspecified: Secondary | ICD-10-CM | POA: Diagnosis not present

## 2016-03-20 DIAGNOSIS — E119 Type 2 diabetes mellitus without complications: Secondary | ICD-10-CM | POA: Diagnosis not present

## 2016-03-20 LAB — CBC
HCT: 39 % (ref 39.0–52.0)
Hemoglobin: 13.3 g/dL (ref 13.0–17.0)
MCH: 28.8 pg (ref 26.0–34.0)
MCHC: 34.1 g/dL (ref 30.0–36.0)
MCV: 84.4 fL (ref 78.0–100.0)
PLATELETS: 261 10*3/uL (ref 150–400)
RBC: 4.62 MIL/uL (ref 4.22–5.81)
RDW: 14.8 % (ref 11.5–15.5)
WBC: 4.9 10*3/uL (ref 4.0–10.5)

## 2016-03-20 LAB — BASIC METABOLIC PANEL
Anion gap: 9 (ref 5–15)
BUN: 16 mg/dL (ref 6–20)
CALCIUM: 9.4 mg/dL (ref 8.9–10.3)
CHLORIDE: 101 mmol/L (ref 101–111)
CO2: 28 mmol/L (ref 22–32)
CREATININE: 1.06 mg/dL (ref 0.61–1.24)
GFR calc non Af Amer: >60 mL/min (ref >60)
Glucose, Bld: 122 mg/dL — ABNORMAL HIGH (ref 65–99)
Potassium: 3.6 mmol/L (ref 3.5–5.1)
SODIUM: 138 mmol/L (ref 135–145)

## 2016-03-20 LAB — CBG MONITORING, ED: GLUCOSE-CAPILLARY: 127 mg/dL — AB (ref 65–99)

## 2016-03-20 LAB — I-STAT TROPONIN, ED: TROPONIN I, POC: 0 ng/mL (ref 0.00–0.08)

## 2016-03-20 NOTE — Discharge Instructions (Signed)
Your episode is likely related to low blood pressure from being dehydrated. We discussed follow-up with your heart doctor, Dr. Sharyn LullHarwani. You have an appointment for this Friday. If you continue to have episodes, especially if you have associated chest pain, sweating, shortness of breath, weakness, please follow-up in the emergency department.    Syncope Syncope means a person passes out (faints). The person usually wakes up in less than 5 minutes. It is important to seek medical care for syncope. HOME CARE  Have someone stay with you until you feel normal.  Do not drive, use machines, or play sports until your doctor says it is okay.  Keep all doctor visits as told.  Lie down when you feel like you might pass out. Take deep breaths. Wait until you feel normal before standing up.  Drink enough fluids to keep your pee (urine) clear or pale yellow.  If you take blood pressure or heart medicine, get up slowly. Take several minutes to sit and then stand. GET HELP RIGHT AWAY IF:   You have a severe headache.  You have pain in the chest, belly (abdomen), or back.  You are bleeding from the mouth or butt (rectum).  You have black or tarry poop (stool).  You have an irregular or very fast heartbeat.  You have pain with breathing.  You keep passing out, or you have shaking (seizures) when you pass out.  You pass out when sitting or lying down.  You feel confused.  You have trouble walking.  You have severe weakness.  You have vision problems. If you fainted, call for help (911 in [place] Do not drive yourself to the hospital.   This information is not intended to replace advice given to you by your health care provider. Make sure you discuss any questions you have with your health care provider.   Document Released: 04/23/2008 Document Revised: 03/22/2015 Document Reviewed: 01/04/2012 Elsevier Interactive Patient Education [employer]2016 Elsevier Inc.

## 2016-03-20 NOTE — ED Notes (Signed)
Patient called in main ED waiting area with no response 

## 2016-03-20 NOTE — ED Notes (Signed)
Patient reports that he had syncopal episode after mowing yeard on Friday. Denies pain, no complaints. Wants to know why he passed out

## 2016-03-20 NOTE — ED Provider Notes (Signed)
CSN: [id number]     Arrival date & time 03/20/16  1011 History   First MD Initiated Contact with Patient 03/20/16 1326     Chief Complaint  Patient presents with  . syncope on friday      (Consider location/radiation/quality/duration/timing/severity/associated sxs/prior Treatment) HPI Patient presents to the [facility] for evaluation history of syncope. Patient states this episode occurred four days ago. He reports mowing about 2-3 lawns during the day after drinking a large jug of cranberry juice. Later on that day, he reports being around a friend, where he developed dizziness and subsequently passed out. He was unconscious for about 2-3 minutes. He reports no head trauma. No associated headache, vision changes, nausea, vomiting, chest pain, dyspnea, palpitations, urinary/bladder incontinence or abnormal movements. Patient states he currently sees Dr. Sharyn LullHarwani for his diabetes and high cholesterol. He takes metformin and pravastatin.   Past Medical History  Diagnosis Date  . Diabetes mellitus   . Hyperlipidemia   . Appendicitis with abscess    Past Surgical History  Procedure Laterality Date  . Laparoscopic appendectomy  08/21/2012    Procedure: APPENDECTOMY LAPAROSCOPIC;  Surgeon: Adolph Pollackodd J Rosenbower, MD;  Location: [facility];  Service: General;  Laterality: N/A;  . Appendectomy     No family history on file. Social History  Substance Use Topics  . Smoking status: Never Smoker   . Smokeless tobacco: Never Used  . Alcohol Use: No    Review of Systems  Constitutional: Negative for fever.  Respiratory: Negative for cough, chest tightness and shortness of breath.   Cardiovascular: Negative for chest pain.  Gastrointestinal: Negative for nausea, vomiting and diarrhea.  All other systems reviewed and are negative.     Allergies  Chocolate  Home Medications   Prior to Admission medications   Medication Sig Start Date End Date Taking? Authorizing Provider  cyclobenzaprine  (FLEXERIL) 10 MG tablet Take 1 tablet (10 mg total) by mouth 2 (two) times daily as needed for muscle spasms. 12/09/14   Ladona MowJoe Mintz, PA-C  ibuprofen (ADVIL,MOTRIN) 800 MG tablet Take 1 tablet (800 mg total) by mouth 3 (three) times daily. 12/09/14   Ladona MowJoe Mintz, PA-C  metFORMIN (GLUCOPHAGE) 500 MG tablet Take 500 mg by mouth every morning.     Historical Provider, MD  pravastatin (PRAVACHOL) 40 MG tablet Take 40 mg by mouth at bedtime.     Historical Provider, MD   BP 109/70 mmHg  Pulse 56  Temp(Src) 98 F (36.7 C) (Oral)  Resp 18  Ht 5\' 4"  (1.626 m)  Wt 72.576 kg  BMI 27.45 kg/m2  SpO2 100%   Physical Exam  Constitutional: He is oriented to person, place, and time. He appears well-developed and well-nourished.  HENT:  Mouth/Throat: Oropharynx is clear and moist.  Eyes: Conjunctivae and EOM are normal. Pupils are equal, round, and reactive to light.  Neck: Normal range of motion. Neck supple.  Cardiovascular: Normal rate and regular rhythm.   No murmur heard. Pulmonary/Chest: Effort normal and breath sounds normal. No respiratory distress. He has no wheezes.  Abdominal: Soft. Bowel sounds are normal. There is no tenderness. There is no rebound and no guarding.  Musculoskeletal: Normal range of motion. He exhibits no edema or tenderness.  Neurological: He is alert and oriented to person, place, and time. He has normal strength. No cranial nerve deficit or sensory deficit.  Skin: Skin is warm and dry.    ED Course  Procedures (including critical care time) Labs Review Labs Reviewed  BASIC METABOLIC PANEL - Abnormal; Notable for the following:    Glucose, Bld 122 (*)    All other components within normal limits  CBG MONITORING, ED - Abnormal; Notable for the following:    Glucose-Capillary 127 (*)    All other components within normal limits  CBC  I-STAT TROPOININ, ED    Imaging Review No results found. I have personally reviewed and evaluated these images and lab results as  part of my medical decision-making.   EKG Interpretation   Date/Time:  Tuesday Mar 20 2016 14:02:03 EDT Ventricular Rate:  52 PR Interval:  138 QRS Duration: 101 QT Interval:  418 QTC Calculation: 389 R Axis:   59 Text Interpretation:  Sinus rhythm Minimal ST elevation, anterior leads  diffuse ST elevation No significant change was found Confirmed by Manus Gunning   MD, STEPHEN [phone]) on 03/20/2016 2:16:40 PM      MDM   Final diagnoses:  Syncope, unspecified syncope type   Likely secondary to orthostatic hypotension. Orthostatic vitals normal today. EKG significant for diffuse ST elevation with no ACS symptoms. Troponin negative.Syncopal episode likely secondary to orthostatic hypotension secondary to hypovolemia. Discussed with Dr. Sharyn Lull who will see patient this week. Return precautions discussed. Patient pain free and stable for discharge home.    Narda Bonds, MD 03/20/16 1530  Glynn Octave, MD 03/20/16 [phone]

## 2016-03-20 NOTE — ED Notes (Signed)
CBG 127 

## 2016-08-05 ENCOUNTER — Encounter (HOSPITAL_COMMUNITY): Payer: Self-pay | Admitting: Emergency Medicine

## 2016-08-05 ENCOUNTER — Emergency Department (HOSPITAL_COMMUNITY)
Admission: EM | Admit: 2016-08-05 | Discharge: 2016-08-06 | Disposition: A | Discharge status: Home / Self Care | Payer: Medicare Other | Attending: Emergency Medicine | Admitting: Emergency Medicine

## 2016-08-05 DIAGNOSIS — M542 Cervicalgia: Secondary | ICD-10-CM | POA: Diagnosis present

## 2016-08-05 DIAGNOSIS — E119 Type 2 diabetes mellitus without complications: Secondary | ICD-10-CM | POA: Insufficient documentation

## 2016-08-05 DIAGNOSIS — Z7984 Long term (current) use of oral hypoglycemic drugs: Secondary | ICD-10-CM | POA: Diagnosis not present

## 2016-08-05 DIAGNOSIS — M62838 Other muscle spasm: Secondary | ICD-10-CM

## 2016-08-05 MED ORDER — IBUPROFEN 600 MG PO TABS: 600.0000 mg | ORAL_TABLET | Freq: Four times a day (QID) | ORAL | 0 refills | Status: DC | PRN

## 2016-08-05 MED ORDER — KETOROLAC TROMETHAMINE 30 MG/ML IJ SOLN
15.0000 mg | Freq: Once | INTRAMUSCULAR | Status: AC
  Administered 2016-08-05: 15 mg via INTRAMUSCULAR
  Filled 2016-08-05: qty 1

## 2016-08-05 MED ORDER — CYCLOBENZAPRINE HCL 10 MG PO TABS: 10.0000 mg | ORAL_TABLET | Freq: Two times a day (BID) | ORAL | 0 refills | Status: DC | PRN

## 2016-08-05 MED ORDER — DIAZEPAM 5 MG/ML IJ SOLN
5.0000 mg | Freq: Once | INTRAMUSCULAR | Status: AC
  Administered 2016-08-05: 5 mg via INTRAMUSCULAR
  Filled 2016-08-05: qty 2

## 2016-08-05 NOTE — ED Provider Notes (Signed)
[facility]EMER[facility]ENCY DEPT Provider Note   CSN: [id number] Arrival date & time: 08/05/16  2036   By signing my name below, I, Christel Mormon, attest that this documentation has been prepared under the direction and in the presence of Roxy Horseman, [place]. Electronically Signed: Christel Mormon, Scribe. 08/05/2016. 11:06 PM.   History   Chief Complaint Chief Complaint  Patient presents with  . Neck Pain   The history is provided by the patient. No language interpreter was used.   HPI Comments:  Paul Key is a 40 y.o. male with PMHx of DM and HLD who presents to the Emergency Department complaining of wosening L-sided neck stiffness x4 days. Pt states that he woke up in the morning with a stiff neck. Pt complains of associated tenderness and decreased ROM in the neck. Pt states that he took Advil with no relief. Pt denies other complaints.   Past Medical History:  Diagnosis Date  . Appendicitis with abscess   . Diabetes mellitus   . Hyperlipidemia     Patient Active Problem List   Diagnosis Date Noted  . Appendicitis with abscess 07/01/2012    Past Surgical History:  Procedure Laterality Date  . APPENDECTOMY    . LAPAROSCOPIC APPENDECTOMY  08/21/2012   Procedure: APPENDECTOMY LAPAROSCOPIC;  Surgeon: Adolph Pollack, MD;  Location: [facility];  Service: General;  Laterality: N/A;       Home Medications    Prior to Admission medications   Medication Sig Start Date End Date Taking? Authorizing Provider  metFORMIN (GLUCOPHAGE) 500 MG tablet Take 500 mg by mouth every morning.     Historical Provider, MD  pravastatin (PRAVACHOL) 40 MG tablet Take 40 mg by mouth at bedtime.     Historical Provider, MD    Family History No family history on file.  Social History Social History  Substance Use Topics  . Smoking status: Never Smoker  . Smokeless tobacco: Never Used  . Alcohol use No     Allergies   Chocolate   Review of Systems Review of Systems  Constitutional:  Negative for chills and fever.  Musculoskeletal: Positive for myalgias, neck pain and neck stiffness.     Physical Exam Updated Vital Signs BP 128/85   Pulse (!) 58   Temp 98.3 F (36.8 C) (Oral)   Resp 18   SpO2 100%   Physical Exam Physical Exam  Constitutional: Pt appears well-developed and well-nourished. No distress.  HENT:  Head: Normocephalic and atraumatic.  Mouth/Throat: Oropharynx is clear and moist. No oropharyngeal exudate.  Eyes: Conjunctivae are normal.  Neck: Normal range of motion. Neck supple.  No meningismus Cardiovascular: Normal rate, regular rhythm and intact distal pulses.   Pulmonary/Chest: Effort normal and breath sounds normal. No respiratory distress. Pt has no wheezes.  Abdominal: Pt exhibits no distension Musculoskeletal:  Cervical paraspinal muscles and upper trapezius tender to palpation, no bony CTLS spine tenderness, deformity, step-off, or crepitus Lymphadenopathy: Pt has no cervical adenopathy.  Neurological: Pt is alert and oriented Speech is clear and goal oriented, follows commands Normal 5/5 strength in upper and lower extremities bilaterally including dorsiflexion and plantar flexion, strong and equal grip strength Sensation intact Moves extremities without ataxia, coordination intact Normal gait Normal balance Skin: Skin is warm and dry. No rash noted. Pt is not diaphoretic. No erythema.  Psychiatric: Pt has a normal mood and affect. Behavior is normal.  Nursing note and vitals reviewed.   ED Treatments / Results  DIAGNOSTIC STUDIES:  Oxygen Saturation is 100%  on RA, normal by my interpretation.    COORDINATION OF CARE:  11:06 PM Discussed treatment plan with pt at bedside and pt agreed to plan.   Labs (all labs ordered are listed, but only abnormal results are displayed) Labs Reviewed - No data to display  EKG  EKG Interpretation None       Radiology No results found.  Procedures Procedures (including critical  care time)  Medications Ordered in ED Medications - No data to display   Initial Impression / Assessment and Plan / ED Course  I have reviewed the triage vital signs and the nursing notes.  Pertinent labs & imaging results that were available during my care of the patient were reviewed by me and considered in my medical decision making (see chart for details).  Clinical Course    Patient with muscle tightness and probable spasms of upper trapezius and cervical paraspinal muscles. Onset after sleeping for days ago. No fever. No meningismus. Will treat with muscle relaxer. Recommend ice and heat. Gentle stretching. Primary care follow-up.   Final Clinical Impressions(s) / ED Diagnoses   Final diagnoses:  Muscle spasms of neck  Trapezius muscle spasm    New Prescriptions New Prescriptions   CYCLOBENZAPRINE (FLEXERIL) 10 MG TABLET    Take 1 tablet (10 mg total) by mouth 2 (two) times daily as needed for muscle spasms.   IBUPROFEN (ADVIL,MOTRIN) 600 MG TABLET    Take 1 tablet (600 mg total) by mouth every 6 (six) hours as needed.  I personally performed the services described in this documentation, which was scribed in my presence. The recorded information has been reviewed and is accurate.       Roxy Horsemanobert Ernestine Langworthy, PA-C 08/06/16 0012    Gilda Creasehristopher J Pollina, MD 08/06/16 [phone]

## 2016-08-05 NOTE — ED Triage Notes (Signed)
C/o stiff neck since waking up 4 days ago.  Denies injury. Denies any other complaints.

## 2016-11-25 ENCOUNTER — Emergency Department (HOSPITAL_COMMUNITY): Payer: Medicare Other

## 2016-11-25 ENCOUNTER — Encounter (HOSPITAL_COMMUNITY): Payer: Self-pay

## 2016-11-25 ENCOUNTER — Emergency Department (HOSPITAL_COMMUNITY)
Admission: EM | Admit: 2016-11-25 | Discharge: 2016-11-25 | Disposition: A | Discharge status: Home / Self Care | Payer: Medicare Other | Attending: Emergency Medicine | Admitting: Emergency Medicine

## 2016-11-25 DIAGNOSIS — T148XXA Other injury of unspecified body region, initial encounter: Secondary | ICD-10-CM

## 2016-11-25 DIAGNOSIS — S161XXA Strain of muscle, fascia and tendon at neck level, initial encounter: Secondary | ICD-10-CM | POA: Insufficient documentation

## 2016-11-25 DIAGNOSIS — Z7984 Long term (current) use of oral hypoglycemic drugs: Secondary | ICD-10-CM | POA: Insufficient documentation

## 2016-11-25 DIAGNOSIS — Y929 Unspecified place or not applicable: Secondary | ICD-10-CM | POA: Insufficient documentation

## 2016-11-25 DIAGNOSIS — X58XXXA Exposure to other specified factors, initial encounter: Secondary | ICD-10-CM | POA: Insufficient documentation

## 2016-11-25 DIAGNOSIS — Y93H2 Activity, gardening and landscaping: Secondary | ICD-10-CM | POA: Insufficient documentation

## 2016-11-25 DIAGNOSIS — S199XXA Unspecified injury of neck, initial encounter: Secondary | ICD-10-CM | POA: Diagnosis present

## 2016-11-25 DIAGNOSIS — Y990 Civilian activity done for income or pay: Secondary | ICD-10-CM | POA: Insufficient documentation

## 2016-11-25 DIAGNOSIS — M7918 Myalgia, other site: Secondary | ICD-10-CM

## 2016-11-25 DIAGNOSIS — E119 Type 2 diabetes mellitus without complications: Secondary | ICD-10-CM | POA: Diagnosis not present

## 2016-11-25 DIAGNOSIS — R0789 Other chest pain: Secondary | ICD-10-CM | POA: Insufficient documentation

## 2016-11-25 LAB — CBG MONITORING, ED: Glucose-Capillary: 92 mg/dL (ref 65–99)

## 2016-11-25 MED ORDER — IBUPROFEN 600 MG PO TABS: 600.0000 mg | ORAL_TABLET | Freq: Four times a day (QID) | ORAL | 0 refills | Status: DC | PRN

## 2016-11-25 MED ORDER — METHOCARBAMOL 500 MG PO TABS: 500.0000 mg | ORAL_TABLET | Freq: Two times a day (BID) | ORAL | 0 refills | Status: DC

## 2016-11-25 MED ORDER — IBUPROFEN 400 MG PO TABS
400.0000 mg | ORAL_TABLET | Freq: Once | ORAL | Status: AC
  Administered 2016-11-25: 400 mg via ORAL
  Filled 2016-11-25: qty 1

## 2016-11-25 MED ORDER — METHOCARBAMOL 500 MG PO TABS
500.0000 mg | ORAL_TABLET | Freq: Once | ORAL | Status: AC
  Administered 2016-11-25: 500 mg via ORAL
  Filled 2016-11-25: qty 1

## 2016-11-25 NOTE — ED Notes (Signed)
Declined W/C at D/C and was escorted to lobby by RN. 

## 2016-11-25 NOTE — ED Provider Notes (Signed)
[facility] Provider Note   CSN: [id number] Arrival date & time: 11/25/16  1649  By signing my name below, I, Paul Key, attest that this documentation has been prepared under the direction and in the presence of [name], MD. Electronically Signed: Javier Key, ER Scribe. 06/30/2016. 6:13 PM.  History   Chief Complaint Chief Complaint  Patient presents with  . generalized body pain   The history is provided by the patient. No language interpreter was used.    HPI Comments: Paul Key is a 41 y.o. male who presents to the Emergency Department complaining myalgias, worse in arms, lateral chest and neck since last night. He describes the sensation as a squeezing painful sensation. He works as a Administrator. He denies fever, nausea, vomiting, . He has also had several days of cough and sore throat with post tussive emesis yesterday. He has a PMHx of DM and HLD. He takes metformin for his DM. He took ibuprofen 600mg  yesterday before bed but is not sure if it worked.   Past Medical History:  Diagnosis Date  . Appendicitis with abscess   . Diabetes mellitus   . Hyperlipidemia     Patient Active Problem List   Diagnosis Date Noted  . Appendicitis with abscess 07/01/2012    Past Surgical History:  Procedure Laterality Date  . APPENDECTOMY    . LAPAROSCOPIC APPENDECTOMY  08/21/2012   Procedure: APPENDECTOMY LAPAROSCOPIC;  Surgeon: Adolph Pollack, MD;  Location: [facility];  Service: General;  Laterality: N/A;     Home Medications    Prior to Admission medications   Medication Sig Start Date End Date Taking? Authorizing Provider  cyclobenzaprine (FLEXERIL) 10 MG tablet Take 1 tablet (10 mg total) by mouth 2 (two) times daily as needed for muscle spasms. 08/05/16   Roxy Horseman, PA-C  ibuprofen (ADVIL,MOTRIN) 600 MG tablet Take 1 tablet (600 mg total) by mouth every 6 (six) hours as needed. 08/05/16   Roxy Horseman, PA-C  metFORMIN (GLUCOPHAGE) 500 MG tablet  Take 500 mg by mouth every morning.     Historical Provider, MD  pravastatin (PRAVACHOL) 40 MG tablet Take 40 mg by mouth at bedtime.     Historical Provider, MD    Family History No family history on file.  Social History Social History  Substance Use Topics  . Smoking status: Never Smoker  . Smokeless tobacco: Never Used  . Alcohol use No     Allergies   Chocolate   Review of Systems Review of Systems A complete 10 system review of systems was obtained and all systems are negative except as noted in the HPI and PMH.    Physical Exam Updated Vital Signs BP 109/60 (BP Location: Right Arm)   Pulse 71   Temp 98.5 F (36.9 C) (Oral)   Resp 16   SpO2 100%   Physical Exam  Constitutional: He is oriented to person, place, and time. Vital signs are normal. He appears well-developed and well-nourished. No distress.  HENT:  Head: Normocephalic and atraumatic.  Right Ear: Hearing, tympanic membrane, external ear and ear canal normal.  Left Ear: Hearing, tympanic membrane, external ear and ear canal normal.  Nose: Nose normal.  Mouth/Throat: Uvula is midline, oropharynx is clear and moist and mucous membranes are normal. No trismus in the jaw. No oropharyngeal exudate, posterior oropharyngeal erythema or tonsillar abscesses.  Eyes: Conjunctivae and EOM are normal. Pupils are equal, round, and reactive to light.  Neck: Normal range of motion. Neck  supple. No tracheal deviation present.  Cardiovascular: Normal rate, regular rhythm, S1 normal, S2 normal, normal heart sounds, intact distal pulses and normal pulses.   Pulmonary/Chest: Effort normal and breath sounds normal. No respiratory distress. He has no decreased breath sounds. He has no wheezes. He has no rhonchi. He has no rales.  Abdominal: Normal appearance and bowel sounds are normal. There is no tenderness.  Musculoskeletal: Normal range of motion.  Neurological: He is alert and oriented to person, place, and time.  Coordination normal.  Skin: Skin is warm and dry. He is not diaphoretic.  Psychiatric: He has a normal mood and affect. His speech is normal and behavior is normal. Thought content normal.  Nursing note and vitals reviewed.  ED Treatments / Results  DIAGNOSTIC STUDIES: Oxygen Saturation is 100% on RA, normal by my interpretation.    COORDINATION OF CARE: 6:13 PM Discussed treatment plan with pt at bedside and pt agreed to plan.  Labs (all labs ordered are listed, but only abnormal results are displayed) Labs Reviewed  CBG MONITORING, ED    EKG  EKG Interpretation None      Radiology Dg Chest 2 View  Result Date: 11/25/2016 CLINICAL DATA:  41 year old with acute onset of bilateral chest pain, shortness of breath and nonproductive cough which began approximately 3 days ago. EXAM: CHEST  2 VIEW COMPARISON:  08/18/2012, 08/20/2011. FINDINGS: Suboptimal inspiration which accounts for atelectasis in the lower lobes and also accentuates the heart size. Taking this into account, cardiac silhouette upper normal in size. Hilar and mediastinal contours otherwise unremarkable. Lungs otherwise clear. No pleural effusions. No pneumothorax. Visualized bony thorax intact. IMPRESSION: Suboptimal inspiration accounts for atelectasis in the lower lobes. No acute cardiopulmonary disease otherwise. Electronically Signed   By: Hulan Saas M.D.   On: 11/25/2016 19:19    Procedures Procedures (including critical care time)  Medications Ordered in ED Medications - No data to display   Initial Impression / Assessment and Plan / ED Course  I have reviewed the triage vital signs and the nursing notes.  Pertinent labs & imaging results that were available during my care of the patient were reviewed by me and considered in my medical decision making (see chart for details).  Clinical Course     Final Clinical Impressions(s) / ED Diagnoses  {I have reviewed and evaluated the relevant laboratory  values. {I have reviewed and evaluated the relevant imaging studies.  {I have reviewed the relevant previous healthcare records.  {I obtained HPI from historian.   ED Course:  Assessment: Pt is a 40yM who presents with myalgias BUE, bilateral chest wall, trapezius musclculature. 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 Has not tried OTC remedies other than 1 time dose of 600mg  Motrin. On exam, pt in NAD. Nontoxic/nonseptic appearing. VSS. Afebrile. Lungs CTA. Heart RRR. CXR unremarkable. Given analgesia in ED. Likely musculature strain from occupation. Plan is to DC home with follow up to PCP. Given Rx Muscle relaxants. At time of discharge, Patient is in no acute distress. Vital Signs are stable. Patient is able to ambulate. Patient able to tolerate PO.   Disposition/Plan:  DC Home Additional Verbal discharge instructions given and discussed with patient.  Pt Instructed to f/u with PCP in the next week for evaluation and treatment of symptoms. Return precautions given Pt acknowledges and agrees with plan  Supervising Physician Loren Racer, MD  Final diagnoses:  Musculoskeletal pain  Muscle strain  New Prescriptions New Prescriptions   No medications on file    I personally performed the services described in this documentation, which was scribed in my presence. The recorded information has been reviewed and is accurate.      Audry Piliyler Kelyn Ponciano, PA-C 11/25/16 1924    Loren Raceravid Yelverton, MD 11/28/16 1253
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
## 2016-11-25 NOTE — ED Triage Notes (Signed)
Patient complains of 2-3 days of bilateral arm pain, bilateral neck pain and bilateral rib pain. Pain worse with movement and inspiration. Denies trauma, NAD. Lungs clear

## 2017-02-25 ENCOUNTER — Ambulatory Visit (INDEPENDENT_AMBULATORY_CARE_PROVIDER_SITE_OTHER): Payer: Self-pay | Admitting: Orthopedic Surgery

## 2017-09-20 ENCOUNTER — Emergency Department (HOSPITAL_COMMUNITY): Payer: Medicare Other

## 2017-09-20 ENCOUNTER — Emergency Department (HOSPITAL_COMMUNITY)
Admission: EM | Admit: 2017-09-20 | Discharge: 2017-09-20 | Disposition: A | Discharge status: Home / Self Care | Payer: Medicare Other | Attending: Emergency Medicine | Admitting: Emergency Medicine

## 2017-09-20 ENCOUNTER — Encounter (HOSPITAL_COMMUNITY): Payer: Self-pay | Admitting: Emergency Medicine

## 2017-09-20 DIAGNOSIS — Y9301 Activity, walking, marching and hiking: Secondary | ICD-10-CM | POA: Diagnosis not present

## 2017-09-20 DIAGNOSIS — Z043 Encounter for examination and observation following other accident: Secondary | ICD-10-CM | POA: Insufficient documentation

## 2017-09-20 DIAGNOSIS — Y929 Unspecified place or not applicable: Secondary | ICD-10-CM | POA: Diagnosis not present

## 2017-09-20 DIAGNOSIS — E119 Type 2 diabetes mellitus without complications: Secondary | ICD-10-CM | POA: Diagnosis not present

## 2017-09-20 DIAGNOSIS — Y999 Unspecified external cause status: Secondary | ICD-10-CM | POA: Insufficient documentation

## 2017-09-20 DIAGNOSIS — S0993XA Unspecified injury of face, initial encounter: Secondary | ICD-10-CM | POA: Insufficient documentation
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
## 2017-09-20 NOTE — ED Triage Notes (Signed)
Patient also complaining of lower back and neck pain.

## 2017-09-20 NOTE — Discharge Instructions (Signed)
Your x-rays today were normal. I recommend to use tylenol or motrin to help with pain.  Can also use ice to help with swelling. Follow-up with your primary care doctor. Return here for any new/worsening symptoms.
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
## 2018-02-12 ENCOUNTER — Encounter: Payer: Self-pay | Admitting: Podiatry

## 2018-02-12 ENCOUNTER — Ambulatory Visit (INDEPENDENT_AMBULATORY_CARE_PROVIDER_SITE_OTHER): Payer: Medicare Other | Admitting: Podiatry

## 2018-02-12 ENCOUNTER — Ambulatory Visit (INDEPENDENT_AMBULATORY_CARE_PROVIDER_SITE_OTHER): Payer: Medicare Other

## 2018-02-12 DIAGNOSIS — B351 Tinea unguium: Secondary | ICD-10-CM | POA: Diagnosis not present

## 2018-02-12 DIAGNOSIS — M79676 Pain in unspecified toe(s): Secondary | ICD-10-CM

## 2018-02-12 DIAGNOSIS — M778 Other enthesopathies, not elsewhere classified: Secondary | ICD-10-CM

## 2018-02-12 DIAGNOSIS — Q828 Other specified congenital malformations of skin: Secondary | ICD-10-CM

## 2018-02-12 DIAGNOSIS — M779 Enthesopathy, unspecified: Principal | ICD-10-CM

## 2018-02-12 DIAGNOSIS — M7752 Other enthesopathy of left foot: Secondary | ICD-10-CM | POA: Diagnosis not present

## 2018-02-19 NOTE — Progress Notes (Signed)
    Subjective: Patient is a 42 y.o. male with PMHx of DM presenting to the office today as a new patient with a chief complaint of painful callus lesions to the left foot that have been present for several months. He has not done anything to treat the lesions. Walking and bearing weight increases the pain.  Patient also complains of elongated, thickened nails that cause pain while ambulating in shoes. He is unable to trim his own nails. Patient presents today for further treatment and evaluation.  Past Medical History:  Diagnosis Date  . Appendicitis with abscess   . Diabetes mellitus   . Hyperlipidemia     Objective:  Physical Exam General: Alert and oriented x3 in no acute distress  Dermatology: Hyperkeratotic lesions x 2 present on the left foot. Pain on palpation with a central nucleated core noted. Skin is warm, dry and supple bilateral lower extremities. Negative for open lesions or macerations. Nails are tender, long, thickened and dystrophic with subungual debris, consistent with onychomycosis, 1-5 bilateral. No signs of infection noted.  Vascular: Palpable pedal pulses bilaterally. No edema or erythema noted. Capillary refill within normal limits.  Neurological: Epicritic and protective threshold grossly intact bilaterally.   Musculoskeletal Exam: Pain on palpation at the keratotic lesion noted. Range of motion within normal limits bilateral. Muscle strength 5/5 in all groups bilateral.  Assessment: 1. Onychodystrophic nails 1-5 bilateral with hyperkeratosis of nails.  2. Onychomycosis of nail due to dermatophyte bilateral 3. Porokeratosis to the left foot x 2   Plan of Care:  #1 Patient evaluated. #2 Excisional debridement of keratoic lesion using a chisel blade was performed without incident.  #3 Dressed with light dressing. #4 Mechanical debridement of nails 1-5 bilaterally performed using a nail nipper. Filed with dremel without incident.  #5 Patient is to return to  the clinic in 3 months.   Felecia ShellingBrent M. Jeni Duling, DPM [facility]  Dr. Felecia ShellingBrent M. Jos Cygan, DPM    [address]                                        [place], [place] [address]                Office [phone]  Fax [phone]

## 2018-11-03 ENCOUNTER — Emergency Department (HOSPITAL_COMMUNITY): Payer: Medicare Other

## 2018-11-03 ENCOUNTER — Emergency Department (HOSPITAL_COMMUNITY)
Admission: EM | Admit: 2018-11-03 | Discharge: 2018-11-03 | Disposition: A | Discharge status: Home / Self Care | Payer: Medicare Other | Attending: Emergency Medicine | Admitting: Emergency Medicine

## 2018-11-03 ENCOUNTER — Other Ambulatory Visit: Payer: Self-pay

## 2018-11-03 ENCOUNTER — Encounter (HOSPITAL_COMMUNITY): Payer: Self-pay

## 2018-11-03 DIAGNOSIS — E119 Type 2 diabetes mellitus without complications: Secondary | ICD-10-CM | POA: Insufficient documentation

## 2018-11-03 DIAGNOSIS — M779 Enthesopathy, unspecified: Secondary | ICD-10-CM | POA: Diagnosis not present

## 2018-11-03 DIAGNOSIS — M79672 Pain in left foot: Secondary | ICD-10-CM | POA: Diagnosis present

## 2018-11-03 DIAGNOSIS — Z79899 Other long term (current) drug therapy: Secondary | ICD-10-CM | POA: Insufficient documentation

## 2018-11-03 DIAGNOSIS — M778 Other enthesopathies, not elsewhere classified: Secondary | ICD-10-CM

## 2018-11-03 LAB — I-STAT CHEM 8, ED
BUN: 16 mg/dL (ref 6–20)
CALCIUM ION: 1.27 mmol/L (ref 1.15–1.40)
CHLORIDE: 101 mmol/L (ref 98–111)
Creatinine, Ser: 1.1 mg/dL (ref 0.61–1.24)
Glucose, Bld: 108 mg/dL — ABNORMAL HIGH (ref 70–99)
HCT: 39 % (ref 39.0–52.0)
Hemoglobin: 13.3 g/dL (ref 13.0–17.0)
Potassium: 3.9 mmol/L (ref 3.5–5.1)
SODIUM: 140 mmol/L (ref 135–145)
TCO2: 33 mmol/L — ABNORMAL HIGH (ref 22–32)

## 2018-11-03 MED ORDER — NAPROXEN 375 MG PO TABS: 375.0000 mg | ORAL_TABLET | Freq: Two times a day (BID) | ORAL | 0 refills | Status: DC

## 2018-11-03 NOTE — ED Notes (Signed)
Patient transported to X-ray 

## 2018-11-03 NOTE — ED Triage Notes (Signed)
Pt c.o intermittent left leg pain that radiates to his foot. Denies injury.

## 2018-11-03 NOTE — ED Provider Notes (Signed)
[facility] EMERGENCY DEPARTMENT Provider Note   CSN: [id number] Arrival date & time: 11/03/18  1511     History   Chief Complaint Chief Complaint  Patient presents with  . Leg Pain    HPI Paul Key is a 42 y.o. male.  Patient works as a Merchant navy officer. He spends a lot of time walking. He is complaining of intermittent pain to the left heel. He is currently having increased pain to the heel with walking, and has been adjusting his gait to compensate. No known injury to area. Patient is also a diabetic, has not been taking his medication for "a long time". He states the metformin makes him feel depressed. Increased thirst and urination.  The history is provided by the patient. No language interpreter was used.  Leg Pain   This is a new problem. The current episode started more than 1 week ago. The problem occurs daily. The problem has been gradually worsening. The pain is present in the left foot. The quality of the pain is described as aching, dull and intermittent. The pain is moderate. There has been no history of extremity trauma.    Past Medical History:  Diagnosis Date  . Appendicitis with abscess   . Diabetes mellitus   . Hyperlipidemia     Patient Active Problem List   Diagnosis Date Noted  . Appendicitis with abscess 07/01/2012    Past Surgical History:  Procedure Laterality Date  . APPENDECTOMY    . LAPAROSCOPIC APPENDECTOMY  08/21/2012   Procedure: APPENDECTOMY LAPAROSCOPIC;  Surgeon: Adolph Pollack, MD;  Location: [facility];  Service: General;  Laterality: N/A;        Home Medications    Prior to Admission medications   Medication Sig Start Date End Date Taking? Authorizing Provider  cyclobenzaprine (FLEXERIL) 10 MG tablet Take 1 tablet (10 mg total) by mouth 2 (two) times daily as needed for muscle spasms. Patient not taking: Reported on 09/20/2017 08/05/16   Roxy Horseman, PA-C  ibuprofen (ADVIL,MOTRIN) 600 MG tablet Take 1  tablet (600 mg total) by mouth every 6 (six) hours as needed. 11/25/16   [name], PA-C  methocarbamol (ROBAXIN) 500 MG tablet Take 1 tablet (500 mg total) by mouth 2 (two) times daily. Patient not taking: Reported on 09/20/2017 11/25/16   [name], PA-C    Family History No family history on file.  Social History Social History   Tobacco Use  . Smoking status: Never Smoker  . Smokeless tobacco: Never Used  Substance Use Topics  . Alcohol use: No  . Drug use: No     Allergies   Chocolate   Review of Systems Review of Systems  Endocrine: Positive for polydipsia and polyuria.  Musculoskeletal: Positive for arthralgias.  All other systems reviewed and are negative.    Physical Exam Updated Vital Signs BP 118/69 (BP Location: Right Arm)   Pulse 67   Temp 97.9 F (36.6 C) (Oral)   Resp 14   SpO2 97%   Physical Exam Vitals signs and nursing note reviewed.  Constitutional:      General: He is not in acute distress.    Appearance: Normal appearance. He is not ill-appearing.  HENT:     Head: Normocephalic.     Mouth/Throat:     Mouth: Mucous membranes are moist.     Pharynx: Oropharynx is clear.  Eyes:     Conjunctiva/sclera: Conjunctivae normal.  Cardiovascular:     Rate and Rhythm: Normal rate  and regular rhythm.  Pulmonary:     Effort: Pulmonary effort is normal.     Breath sounds: Normal breath sounds.  Abdominal:     Palpations: Abdomen is soft.  Musculoskeletal: Normal range of motion.        General: Tenderness present.     Left foot: Tenderness present.       Feet:     Comments: Tenderness to left heel. Mild swelling noted. No erythema or increased warmth to suggest infection.  Skin:    General: Skin is warm and dry.  Neurological:     Mental Status: He is alert and oriented to person, place, and time.  Psychiatric:        Mood and Affect: Mood normal.      ED Treatments / Results  Labs (all labs ordered are listed, but only abnormal  results are displayed) Labs Reviewed  I-STAT CHEM 8, ED - Abnormal; Notable for the following components:      Result Value   Glucose, Bld 108 (*)    TCO2 33 (*)    All other components within normal limits    EKG None  Radiology Dg Foot Complete Left  Result Date: 11/03/2018 CLINICAL DATA:  42 y/o M; intermittent pain of the left os calcis. No known injury. EXAM: LEFT FOOT - COMPLETE 3+ VIEW COMPARISON:  None. FINDINGS: There is no evidence of fracture or dislocation. Small dorsal and plantar calcaneal enthesophytes noted. Talar neck osteophytes may represent anterior ankle impingement in the appropriate clinical setting. IMPRESSION: 1.  No acute fracture or dislocation identified. 2. Small dorsal and plantar calcaneal enthesophytes. 3. Talar neck osteophytes may represent anterior ankle impingement in the appropriate clinical setting. Electronically Signed   By: Mitzi HansenLance  Furusawa-Stratton M.D.   On: 11/03/2018 17:24    Procedures Procedures (including critical care time)  Medications Ordered in ED Medications - No data to display   Initial Impression / Assessment and Plan / ED Course  I have reviewed the triage vital signs and the nursing notes.  Pertinent labs & imaging results that were available during my care of the patient were reviewed by me and considered in my medical decision making (see chart for details).     Patient X-Ray negative for obvious fracture or dislocation. Plantar enthsopathy noted. Pt advised to follow up with orthopedics. Conservative therapy recommended and discussed. Patient will be discharged home & is agreeable with above plan. Returns precautions discussed. Pt appears safe for discharge.  Final Clinical Impressions(s) / ED Diagnoses   Final diagnoses:  Enthesopathy of foot    ED Discharge Orders         Ordered    naproxen (NAPROSYN) 375 MG tablet  2 times daily     11/03/18 1747           Felicie MornSmith, Taviana Westergren, NP 11/03/18 Harrietta [id number]    Donnetta Hutchingook,  Brian, MD 11/04/18 [phone]

## 2019-01-01 ENCOUNTER — Emergency Department (HOSPITAL_COMMUNITY)
Admission: EM | Admit: 2019-01-01 | Discharge: 2019-01-01 | Disposition: A | Discharge status: Home / Self Care | Payer: Medicare Other | Attending: Emergency Medicine | Admitting: Emergency Medicine

## 2019-01-01 ENCOUNTER — Encounter (HOSPITAL_COMMUNITY): Payer: Self-pay

## 2019-01-01 ENCOUNTER — Emergency Department (HOSPITAL_COMMUNITY): Payer: Medicare Other

## 2019-01-01 DIAGNOSIS — E119 Type 2 diabetes mellitus without complications: Secondary | ICD-10-CM | POA: Insufficient documentation

## 2019-01-01 DIAGNOSIS — M79672 Pain in left foot: Secondary | ICD-10-CM | POA: Insufficient documentation

## 2019-01-01 LAB — CBC WITH DIFFERENTIAL/PLATELET
Abs Immature Granulocytes: 0.01 10*3/uL (ref 0.00–0.07)
BASOS ABS: 0 10*3/uL (ref 0.0–0.1)
Basophils Relative: 0 %
EOS ABS: 0.2 10*3/uL (ref 0.0–0.5)
EOS PCT: 4 %
HCT: 44.5 % (ref 39.0–52.0)
Hemoglobin: 14.4 g/dL (ref 13.0–17.0)
IMMATURE GRANULOCYTES: 0 %
LYMPHS PCT: 28 %
Lymphs Abs: 1.5 10*3/uL (ref 0.7–4.0)
MCH: 26.5 pg (ref 26.0–34.0)
MCHC: 32.4 g/dL (ref 30.0–36.0)
MCV: 81.8 fL (ref 80.0–100.0)
Monocytes Absolute: 0.4 10*3/uL (ref 0.1–1.0)
Monocytes Relative: 8 %
NEUTROS PCT: 60 %
NRBC: 0 % (ref 0.0–0.2)
Neutro Abs: 3.3 10*3/uL (ref 1.7–7.7)
Platelets: 296 10*3/uL (ref 150–400)
RBC: 5.44 MIL/uL (ref 4.22–5.81)
RDW: 15 % (ref 11.5–15.5)
WBC: 5.4 10*3/uL (ref 4.0–10.5)

## 2019-01-01 LAB — BASIC METABOLIC PANEL
ANION GAP: 6 (ref 5–15)
BUN: 18 mg/dL (ref 6–20)
CALCIUM: 9.5 mg/dL (ref 8.9–10.3)
CHLORIDE: 103 mmol/L (ref 98–111)
CO2: 31 mmol/L (ref 22–32)
CREATININE: 1.36 mg/dL — AB (ref 0.61–1.24)
GFR calc Af Amer: >60 mL/min (ref >60)
GFR calc non Af Amer: >60 mL/min (ref >60)
Glucose, Bld: 151 mg/dL — ABNORMAL HIGH (ref 70–99)
Potassium: 4.2 mmol/L (ref 3.5–5.1)
Sodium: 140 mmol/L (ref 135–145)

## 2019-01-01 MED ORDER — NAPROXEN 375 MG PO TABS: 375.0000 mg | ORAL_TABLET | Freq: Two times a day (BID) | ORAL | 0 refills | Status: DC

## 2019-01-01 MED ORDER — ACETAMINOPHEN 325 MG PO TABS
650.0000 mg | ORAL_TABLET | Freq: Once | ORAL | Status: AC
Start: 2019-01-01 — End: 2019-01-01
  Administered 2019-01-01: 650 mg via ORAL
  Filled 2019-01-01: qty 2

## 2019-01-01 NOTE — ED Provider Notes (Signed)
[facility] EMERGENCY DEPARTMENT Provider Note   CSN: [id number] Arrival date & time: 01/01/19  1355     History   Chief Complaint Chief Complaint  Patient presents with  . Foot Pain    HPI Paul Key is a 43 y.o. male with a PMH of Type 2 Diabetes and HLD presenting with left foot pain onset 4-5 days ago. Patient describes pain as a constant ache and states it is worse with walking. Patient states wearing his boots make the pain worse. Patient states Tylenol Arthritis alleviates his pain, but he ran out. Patient denies taking any medications today. Patient states he had similar problems in 10/2018 and was prescribed naproxen. Patient reports naproxen alleviated his pain. Patient reports taking his medications as prescribed. 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 Patient reports mild edema on his left heel. Patient denies any wounds or injuries. Patient denies fever, weakness, numbness, or paresthesias.   HPI  Past Medical History:  Diagnosis Date  . Appendicitis with abscess   . Diabetes mellitus   . Hyperlipidemia     Patient Active Problem List   Diagnosis Date Noted  . Appendicitis with abscess 07/01/2012    Past Surgical History:  Procedure Laterality Date  . APPENDECTOMY    . LAPAROSCOPIC APPENDECTOMY  08/21/2012   Procedure: APPENDECTOMY LAPAROSCOPIC;  Surgeon: Adolph Pollack, MD;  Location: [facility];  Service: General;  Laterality: N/A;        Home Medications    Prior to Admission medications   Medication Sig Start Date End Date Taking? Authorizing Provider  cyclobenzaprine (FLEXERIL) 10 MG tablet Take 1 tablet (10 mg total) by mouth 2 (two) times daily as needed for muscle spasms. Patient not taking: Reported on 09/20/2017 08/05/16   Roxy Horseman, PA-C  ibuprofen (ADVIL,MOTRIN) 600 MG tablet Take 1 tablet (600 mg total) by mouth every 6 (six) hours as needed. 11/25/16   [name], PA-C    methocarbamol (ROBAXIN) 500 MG tablet Take 1 tablet (500 mg total) by mouth 2 (two) times daily. Patient not taking: Reported on 09/20/2017 11/25/16   [name], PA-C  naproxen (NAPROSYN) 375 MG tablet Take 1 tablet (375 mg total) by mouth 2 (two) times daily. 01/01/19   Leretha Dykes, PA-C    Family History History reviewed. No pertinent family history.  Social History Social History   Tobacco Use  . Smoking status: Never Smoker  . Smokeless tobacco: Never Used  Substance Use Topics  . Alcohol use: No  . Drug use: No     Allergies   Chocolate   Review of Systems Review of Systems  Constitutional: Negative for chills, diaphoresis and fever.  Respiratory: Negative for cough and shortness of breath.   Cardiovascular: Negative for chest pain.  Gastrointestinal: Negative for abdominal pain, nausea and vomiting.  Endocrine: Negative for cold intolerance, heat intolerance, polydipsia, polyphagia and polyuria.  Genitourinary: Negative for dysuria.  Musculoskeletal: Positive for arthralgias and joint swelling. Negative for back pain and gait problem.  Skin: Negative for color change and wound.  Allergic/Immunologic: Negative for immunocompromised state.  Neurological: Negative for weakness and numbness.  Hematological: Negative for adenopathy.    Physical Exam Updated Vital Signs BP 113/71 (BP Location: Right Arm)   Pulse 89   Temp 98.1 F (36.7 C) (Oral)   Resp 16   SpO2 96%   Physical Exam Vitals signs and nursing note reviewed.  Constitutional:  General: He is not in acute distress.    Appearance: He is well-developed. He is not diaphoretic.  HENT:     Head: Normocephalic and atraumatic.  Cardiovascular:     Rate and Rhythm: Normal rate and regular rhythm.     Heart sounds: Normal heart sounds. No murmur. No friction rub. No gallop.   Pulmonary:     Effort: Pulmonary effort is normal. No respiratory distress.     Breath sounds: Normal breath sounds. No  wheezing or rales.  Abdominal:     Palpations: Abdomen is soft.     Tenderness: There is no abdominal tenderness.  Musculoskeletal: Normal range of motion.     Right ankle: Normal. He exhibits normal range of motion, no swelling and no ecchymosis.     Left ankle: Normal. He exhibits normal range of motion, no swelling and no ecchymosis.     Right foot: Normal. Normal range of motion. No tenderness or bony tenderness.     Left foot: Normal range of motion. Tenderness and swelling present. No bony tenderness.     Comments: Mild edema noted on left heel. No erythema or wounds present. Full ROM without difficulty. Mild tenderness to palpation of heel. 2+ DP pulses. Sensation intact. 5/5 strength in lower extremities with dorsiflexion and plantar flexion. Patient is able to ambulate with minimal discomfort.   Skin:    Findings: No erythema or rash.  Neurological:     Mental Status: He is alert and oriented to person, place, and time.    ED Treatments / Results  Labs (all labs ordered are listed, but only abnormal results are displayed) Labs Reviewed  BASIC METABOLIC PANEL - Abnormal; Notable for the following components:      Result Value   Glucose, Bld 151 (*)    Creatinine, Ser 1.36 (*)    All other components within normal limits  CBC WITH DIFFERENTIAL/PLATELET    EKG None  Radiology Dg Foot Complete Left  Result Date: 01/01/2019 CLINICAL DATA:  Acute left foot pain and swelling. EXAM: LEFT FOOT - COMPLETE 3+ VIEW COMPARISON:  Radiographs of November 03, 2018. FINDINGS: There is no evidence of fracture or dislocation. There is no evidence of arthropathy or other focal bone abnormality. Soft tissues are unremarkable. IMPRESSION: Negative. Electronically Signed   By: Lupita Raider, M.D.   On: 01/01/2019 15:56    Procedures Procedures (including critical care time)  Medications Ordered in ED Medications  acetaminophen (TYLENOL) tablet 650 mg (650 mg Oral Given 01/01/19 1457)      Initial Impression / Assessment and Plan / ED Course  I have reviewed the triage vital signs and the nursing notes.  Pertinent labs & imaging results that were available during my care of the patient were reviewed by me and considered in my medical decision making (see chart for details).  Clinical Course as of Jan 01 1605  Thu Jan 01, 2019  1505 Elevated creatinine at 1.36. Will encourage patient to drink fluids.  Creatinine(!): 1.36 [AH]  1559 Foot x ray negative for acute abnormality. Images reviewed by me.  DG Foot Complete Left [AH]    Clinical Course User Index [AH] Leretha Dykes, PA-C   Patient X-Ray negative for obvious fracture or dislocation. Pain managed in ED. Pt advised to follow up with orthopedics if symptoms persist. Discussed conservative therapy recommended. Prescribed naproxen for pain. Advised patient to follow up with PCP in 1 week. Discussed plan with patient and patient states he understands and  agrees with plan. Patient will be dc home & is agreeable with above plan.   Final Clinical Impressions(s) / ED Diagnoses   Final diagnoses:  Foot pain, left    ED Discharge Orders         Ordered    naproxen (NAPROSYN) 375 MG tablet  2 times daily     01/01/19 [address]03           Dorie Ohms AlderP, [place] JerseyPA-C 01/01/19 1607    Benjiman CorePickering, Nathan, MD 01/01/19 1640

## 2019-01-01 NOTE — ED Notes (Signed)
ED Provider at bedside. 

## 2019-01-01 NOTE — ED Notes (Signed)
Pt given water with meds. Tolerating well.

## 2019-01-01 NOTE — Discharge Instructions (Addendum)
You have been seen today for foot pain. Please read and follow all provided instructions.   1. Medications: naproxen for pain, usual home medications 2. Treatment: rest, drink plenty of fluids 3. Follow Up: Please follow up with your primary doctor in 7 days for discussion of your diagnoses and further evaluation after today's visit; if you do not have a primary care doctor use the resource guide provided to find one; Please return to the ER for any new or worsening symptoms. Please obtain all of your results from medical records or have your doctors office obtain the results - share them with your doctor - you should be seen at your doctors office. Call today to arrange your follow up.   Take medications as prescribed. Please review all of the medicines and only take them if you do not have an allergy to them. Return to the emergency room for worsening condition or new concerning symptoms. Follow up with your regular doctor. If you don't have a regular doctor use one of the numbers below to establish a primary care doctor.  Please be aware that if you are taking birth control pills, taking other prescriptions, ESPECIALLY ANTIBIOTICS may make the birth control ineffective - if this is the case, either do not engage in sexual activity or use alternative methods of birth control such as condoms until you have finished the medicine and your family doctor says it is OK to restart them. If you are on a blood thinner such as COUMADIN, be aware that any other medicine that you take may cause the coumadin to either work too much, or not enough - you should have your coumadin level rechecked in next 7 days if this is the case.  ?  It is also a possibility that you have an allergic reaction to any of the medicines that you have been prescribed - Everybody reacts differently to medications and while MOST people have no trouble with most medicines, you may have a reaction such as nausea, vomiting, rash, swelling,  shortness of breath. If this is the case, please stop taking the medicine immediately and contact your physician.  ?  You should return to the ER if you develop severe or worsening symptoms.   Emergency Department Resource Guide 1) Find a Doctor and Pay Out of Pocket Although you won't have to find out who is covered by your insurance plan, it is a good idea to ask around and get recommendations. You will then need to call the office and see if the doctor you have chosen will accept you as a new patient and what types of options they offer for patients who are self-pay. Some doctors offer discounts or will set up payment plans for their patients who do not have insurance, but you will need to ask so you aren't surprised when you get to your appointment.  2) Contact Your Local Health Department Not all health departments have doctors that can see patients for sick visits, but many do, so it is worth a call to see if yours does. If you don't know where your local health department is, you can check in your phone book. The CDC also has a tool to help you locate your state's health department, and many state websites also have listings of all of their local health departments.  3) Find a Walk-in Clinic If your illness is not likely to be very severe or complicated, you may want to try a walk in clinic. These are popping  up all over the country in pharmacies, drugstores, and shopping centers. They're usually staffed by nurse practitioners or physician assistants that have been trained to treat common illnesses and complaints. They're usually fairly quick and inexpensive. However, if you have serious medical issues or chronic medical problems, these are probably not your best option.  No Primary Care Doctor: Call Health Connect at  [phone] - they can help you locate a primary care doctor that  accepts your insurance, provides certain services, etc. Physician Referral [phone]  Emergency  Department Resource Guide 1) Find a Doctor and Pay Out of Pocket Although you won't have to find out who is covered by your insurance plan, it is a good idea to ask around and get recommendations. You will then need to call the office and see if the doctor you have chosen will accept you as a new patient and what types of options they offer for patients who are self-pay. Some doctors offer discounts or will set up payment plans for their patients who do not have insurance, but you will need to ask so you aren't surprised when you get to your appointment.  2) Contact Your Local Health Department Not all health departments have doctors that can see patients for sick visits, but many do, so it is worth a call to see if yours does. If you don't know where your local health department is, you can check in your phone book. The CDC also has a tool to help you locate your state's health department, and many state websites also have listings of all of their local health departments.  3) Find a [facility] If your illness is not likely to be very severe or complicated, you may want to try a walk in clinic. These are popping up all over the country in pharmacies, drugstores, and shopping centers. They're usually staffed by nurse practitioners or physician assistants that have been trained to treat common illnesses and complaints. They're usually fairly quick and inexpensive. However, if you have serious medical issues or chronic medical problems, these are probably not your best option.  No Primary Care Doctor: Call Health Connect at  [phone] - they can help you locate a primary care doctor that  accepts your insurance, provides certain services, etc. Physician Referral Service- [phone]  Chronic Pain Problems: Organization         Address  Phone   Notes  [facility]  [phone] Patients need to be referred by their primary care doctor.   Medication  Assistance: Organization         Address  Phone   Notes  [facility] Medication [facility] [address] [place], [place] [address] [phone] --Must be a resident of [facility] -- Must have NO insurance coverage whatsoever (no Medicaid/ Medicare, etc.) -- The pt. MUST have a primary care doctor that directs their care regularly and follows them in the community   [facility]  [phone]   [employer]  [phone]    Agencies that provide inexpensive medical care: Organization         Address  Phone   Notes  [place]  [phone]   [facility]    [phone]   [facility] [place],  [address] [phone]   [place] [address], [place] [phone]   [facility]    [phone]  [facility]    [phone]   [facility]  [address], [place] Phone:  [phone], Fax:  [phone] Hours of Operation:  9 am - 6 pm, M-F.  Also accepts Medicaid/Medicare and self-pay.  [facility] for [address], [address], [place] Phone: [phone], Fax: [phone]. Hours of Operation:  8:30 am - 5:30 pm, M-F.  Also accepts Medicaid and self-pay.  [facility] [place] [address], [place] Phone: [phone]   [place], [place], [place] [phone], [address]. [phone] Mondays & Thursdays: 7-9 AM.  First 15 patients are seen on a first come, first serve basis.    [place] Providers:  Organization         Address  Phone   Notes  [facility] [address], [place] [phone] Also accepts self-pay patients.  [facility] [address], [place]  [phone]   [place], [address], [place]  [phone]   [facility] [address] [place] [phone]   Lucianne Lei [address], [place]   [phone] Only accepts [place] [facility] [place] patients after they have their name applied to their card.   Self-Pay (no insurance) in [facility]:  Organization         Address  Phone   Notes  Sickle Cell Patients, [facility] [phone]   [facility] [place] [phone]   [facility] [place]  [place], [place], [place] [phone]   [facility]/Dr. Osei-Bonsu  [address] [place] or [address], [place] [phone] Phone number for both [place] and [place] locations is the same.  [facility] and [facility] [address], [place] [phone]   [facility] [address], [place] or [address] [phone]   [facility] [address], [place] [phone], phone; [phone], fax Sees patients 1st and 3rd Saturday of every month.  Must not qualify for public or private insurance (i.e. Medicaid, Medicare, [place] Health Choice, Veterans' Benefits)  Household income should be no more than 200% of the poverty level The clinic cannot treat you if you are pregnant or think you are pregnant  Sexually transmitted diseases are not treated at the clinic.

## 2019-01-01 NOTE — ED Triage Notes (Signed)
Pt endorses left foot pain around the bottom of the heel x 2 days. Has been here before for same and ran out of the medication that was helping him and could not get a refill of it. Does not remember medication name.

## 2019-01-01 NOTE — ED Notes (Signed)
Patient transported to X-ray 

## 2019-01-01 NOTE — ED Notes (Signed)
Pt verbalized understanding of discharge instructions and denies any further questions at this time.   

## 2019-09-09 ENCOUNTER — Emergency Department (HOSPITAL_COMMUNITY)
Admission: EM | Admit: 2019-09-09 | Discharge: 2019-09-09 | Disposition: A | Discharge status: Home / Self Care | Payer: Medicare Other | Attending: Emergency Medicine | Admitting: Emergency Medicine

## 2019-09-09 DIAGNOSIS — Z23 Encounter for immunization: Secondary | ICD-10-CM | POA: Insufficient documentation

## 2019-09-09 DIAGNOSIS — Y92811 Bus as the place of occurrence of the external cause: Secondary | ICD-10-CM | POA: Diagnosis not present

## 2019-09-09 DIAGNOSIS — W540XXA Bitten by dog, initial encounter: Secondary | ICD-10-CM | POA: Diagnosis not present

## 2019-09-09 DIAGNOSIS — Y939 Activity, unspecified: Secondary | ICD-10-CM | POA: Diagnosis not present

## 2019-09-09 DIAGNOSIS — S81851A Open bite, right lower leg, initial encounter: Secondary | ICD-10-CM | POA: Diagnosis not present

## 2019-09-09 DIAGNOSIS — E119 Type 2 diabetes mellitus without complications: Secondary | ICD-10-CM | POA: Insufficient documentation

## 2019-09-09 DIAGNOSIS — Y999 Unspecified external cause status: Secondary | ICD-10-CM | POA: Insufficient documentation

## 2019-09-09 DIAGNOSIS — Z791 Long term (current) use of non-steroidal anti-inflammatories (NSAID): Secondary | ICD-10-CM | POA: Insufficient documentation

## 2019-09-09 MED ORDER — NAPROXEN 250 MG PO TABS
500.0000 mg | ORAL_TABLET | Freq: Once | ORAL | Status: AC
  Administered 2019-09-09: 14:00:00 500 mg via ORAL
  Filled 2019-09-09: qty 2

## 2019-09-09 MED ORDER — TETANUS-DIPHTH-ACELL PERTUSSIS 5-2.5-18.5 LF-MCG/0.5 IM SUSP
0.5000 mL | Freq: Once | INTRAMUSCULAR | Status: AC
  Administered 2019-09-09: 14:00:00 0.5 mL via INTRAMUSCULAR
  Filled 2019-09-09: qty 0.5

## 2019-09-09 MED ORDER — RABIES VACCINE, PCEC IM SUSR
1.0000 mL | Freq: Once | INTRAMUSCULAR | Status: DC
  Filled 2019-09-09: qty 1

## 2019-09-09 MED ORDER — BACITRACIN ZINC 500 UNIT/GM EX OINT
TOPICAL_OINTMENT | Freq: Once | CUTANEOUS | Status: AC
  Administered 2019-09-09: 1 via TOPICAL
  Filled 2019-09-09: qty 0.9

## 2019-09-09 MED ORDER — RABIES IMMUNE GLOBULIN 150 UNIT/ML IM INJ
1500.0000 [IU] | INJECTION | Freq: Once | INTRAMUSCULAR | Status: DC
  Filled 2019-09-09: qty 10

## 2019-09-09 NOTE — ED Triage Notes (Addendum)
Pt got bit on right calf by dog walking to bus stop. Abrasion to R calf. Unsure if skin punctured. He wants rabies series. Unknown tetanus

## 2019-09-09 NOTE — ED Notes (Signed)
Pt given ice pack

## 2019-09-09 NOTE — ED Notes (Signed)
Pt given discharge paper work and empty ice pack, pt verbalized understanding of pain management, wound management, and return precautions.

## 2019-09-09 NOTE — ED Provider Notes (Signed)
[facility] EMERGENCY DEPARTMENT Provider Note   CSN: [id number] Arrival date & time: 09/09/19  1250     History   Chief Complaint Chief Complaint  Patient presents with  . Animal Bite    HPI Paul Key is a 43 y.o. male.     Patient is a 43 year old gentleman with past medical history of hyperlipidemia, diabetes presenting to the emergency department for dog bite.  Patient reports that he was sitting at the bus stop when a dog suddenly came up and bit him in the back of the right calf.  Reports that it was unprovoked.  Reports that the dog was of a small size.  Reports 1 small wound on the back of his right calf.  Reports that he knows who owns the dog and the animal control came and took the dog away.      Past Medical History:  Diagnosis Date  . Appendicitis with abscess   . Diabetes mellitus   . Hyperlipidemia     Patient Active Problem List   Diagnosis Date Noted  . Appendicitis with abscess 07/01/2012    Past Surgical History:  Procedure Laterality Date  . APPENDECTOMY    . LAPAROSCOPIC APPENDECTOMY  08/21/2012   Procedure: APPENDECTOMY LAPAROSCOPIC;  Surgeon: Adolph Pollack, MD;  Location: [facility]S;  Service: General;  Laterality: N/A;        Home Medications    Prior to Admission medications   Medication Sig Start Date End Date Taking? Authorizing Provider  cyclobenzaprine (FLEXERIL) 10 MG tablet Take 1 tablet (10 mg total) by mouth 2 (two) times daily as needed for muscle spasms. Patient not taking: Reported on 09/20/2017 08/05/16   Roxy Horseman, PA-C  ibuprofen (ADVIL,MOTRIN) 600 MG tablet Take 1 tablet (600 mg total) by mouth every 6 (six) hours as needed. 11/25/16   [name], PA-C  methocarbamol (ROBAXIN) 500 MG tablet Take 1 tablet (500 mg total) by mouth 2 (two) times daily. Patient not taking: Reported on 09/20/2017 11/25/16   [name], PA-C  naproxen (NAPROSYN) 375 MG tablet Take 1 tablet (375 mg total) by mouth 2 (two)  times daily. 01/01/19   Leretha Dykes, PA-C    Family History No family history on file.  Social History Social History   Tobacco Use  . Smoking status: Never Smoker  . Smokeless tobacco: Never Used  Substance Use Topics  . Alcohol use: No  . Drug use: No     Allergies   Chocolate   Review of Systems Review of Systems  Constitutional: Negative for appetite change, chills, fatigue and unexpected weight change.  Respiratory: Negative for cough.   Musculoskeletal: Negative for arthralgias, gait problem and joint swelling.  Skin: Positive for wound.  Allergic/Immunologic: Negative for immunocompromised state.  Neurological: Negative for dizziness, light-headedness and headaches.  Hematological: Does not bruise/bleed easily.     Physical Exam Updated Vital Signs BP (!) 147/82 (BP Location: Left Arm)   Pulse 81   Temp 98.5 F (36.9 C) (Oral)   Resp 16   Wt 78.2 kg   SpO2 100%   BMI 29.59 kg/m   Physical Exam Vitals signs and nursing note reviewed.  Constitutional:      Appearance: Normal appearance.  HENT:     Head: Normocephalic.  Eyes:     Conjunctiva/sclera: Conjunctivae normal.  Pulmonary:     Effort: Pulmonary effort is normal.  Musculoskeletal:        General: Tenderness present. No swelling, deformity  or signs of injury.     Right lower leg: No edema.     Left lower leg: No edema.  Skin:    General: Skin is dry.     Comments: There is a very small 3 to 4 mm abrasion without bleeding to the skin of the posterior right calf.  No surrounding swelling or ecchymosis.  No other injuries.  Neurological:     Mental Status: He is alert.  Psychiatric:        Mood and Affect: Mood normal.      ED Treatments / Results  Labs (all labs ordered are listed, but only abnormal results are displayed) Labs Reviewed - No data to display  EKG None  Radiology No results found.  Procedures Procedures (including critical care time)  Medications Ordered  in ED Medications  bacitracin ointment (has no administration in time range)  naproxen (NAPROSYN) tablet 500 mg (has no administration in time range)  Tdap (BOOSTRIX) injection 0.5 mL (has no administration in time range)     Initial Impression / Assessment and Plan / ED Course  I have reviewed the triage vital signs and the nursing notes.  Pertinent labs & imaging results that were available during my care of the patient were reviewed by me and considered in my medical decision making (see chart for details).  Clinical Course as of Sep 08 1408  Wed Sep 09, 2019  1359 Patient here for very small abrasion to the back of the right calf from a dog bite.  There is no penetrating or bleeding wound.  I do not think that we need to get further imaging as this is all in the soft tissue and is not deep enough to have retained teeth.  Tetanus will need to be updated.  Animal control involved and have possession of the dog. Discussed with patient no need for rabies PEP if animal control has the dog at this time.    [KM]    Clinical Course User Index [KM] Alveria Apley, PA-C       Based on review of vitals, medical screening exam, lab work and/or imaging, there does not appear to be an acute, emergent etiology for the patient's symptoms. Counseled pt on good return precautions and encouraged both PCP and ED follow-up as needed.  Prior to discharge, I also discussed incidental imaging findings with patient in detail and advised appropriate, recommended follow-up in detail.  Clinical Impression: 1. Dog bite, initial encounter     Disposition: Discharge  Prior to providing a prescription for a controlled substance, I independently reviewed the patient's recent prescription history on the [place]. The patient had no recent or regular prescriptions and was deemed appropriate for a brief, less than 3 day prescription of narcotic for acute analgesia.   This note was prepared with assistance of Systems analyst. Occasional wrong-word or sound-a-like substitutions may have occurred due to the inherent limitations of voice recognition software.   Final Clinical Impressions(s) / ED Diagnoses   Final diagnoses:  Dog bite, initial encounter    ED Discharge Orders    None       Alveria Apley, PA-C 09/09/19 [place], [place], DO 09/09/19 1410

## 2019-09-09 NOTE — Discharge Instructions (Addendum)
°  Take over-the-counter Motrin or Tylenol for pain.  Apply ice for swelling.  Keep your wound clean and dry.  Thank you for allowing me to care for you today. Please return to the emergency department if you have new or worsening symptoms. Take your medications as instructed.

## 2019-09-09 NOTE — ED Notes (Signed)
Pt ambulated to and from scale with this RN without difficulty.

## 2019-09-11 ENCOUNTER — Telehealth: Payer: Self-pay

## 2019-09-11 ENCOUNTER — Telehealth (HOSPITAL_COMMUNITY): Payer: Self-pay | Admitting: Physician Assistant

## 2019-09-11 ENCOUNTER — Telehealth: Payer: Self-pay | Admitting: *Deleted

## 2019-09-11 MED ORDER — AMOXICILLIN-POT CLAVULANATE 875-125 MG PO TABS: 1.0000 | ORAL_TABLET | Freq: Two times a day (BID) | ORAL | 0 refills | Status: DC

## 2019-09-11 NOTE — Telephone Encounter (Signed)
New rx sent for augmentin. Please have wound recheck TODAY at primary care or urgent care

## 2019-09-11 NOTE — Telephone Encounter (Signed)
RN CM ED: Paul Key - [phone] states he has been taking Tylenol and using Neosporin as directed to wound however wound has increased in pain and redness. Patient wants to know what to do. RN will discuss with provider and call patient back @ [phone].

## 2019-09-11 NOTE — Telephone Encounter (Signed)
RN CM ED: Andreas Newport [phone]. MD called prescription for Augmentin in to [facility] for patient to pick up. Left voicemail for patient to call RN CM back to notify of prescription being ready for pickup.

## 2019-09-11 NOTE — Telephone Encounter (Signed)
Received return call from patient-states he will pick up prescription today and has follow up appointment scheduled for Wednesday with his PCP.

## 2019-10-01 ENCOUNTER — Emergency Department (HOSPITAL_COMMUNITY): Payer: Medicare Other

## 2019-10-01 ENCOUNTER — Other Ambulatory Visit: Payer: Self-pay

## 2019-10-01 ENCOUNTER — Emergency Department (HOSPITAL_BASED_OUTPATIENT_CLINIC_OR_DEPARTMENT_OTHER): Payer: Medicare Other

## 2019-10-01 ENCOUNTER — Emergency Department (HOSPITAL_COMMUNITY)
Admission: EM | Admit: 2019-10-01 | Discharge: 2019-10-01 | Disposition: A | Discharge status: Home / Self Care | Payer: Medicare Other | Attending: Emergency Medicine | Admitting: Emergency Medicine

## 2019-10-01 ENCOUNTER — Encounter (HOSPITAL_COMMUNITY): Payer: Self-pay | Admitting: Emergency Medicine

## 2019-10-01 DIAGNOSIS — S80811D Abrasion, right lower leg, subsequent encounter: Secondary | ICD-10-CM | POA: Insufficient documentation

## 2019-10-01 DIAGNOSIS — Z5189 Encounter for other specified aftercare: Secondary | ICD-10-CM | POA: Insufficient documentation

## 2019-10-01 DIAGNOSIS — R52 Pain, unspecified: Secondary | ICD-10-CM | POA: Diagnosis not present

## 2019-10-01 DIAGNOSIS — W540XXD Bitten by dog, subsequent encounter: Secondary | ICD-10-CM | POA: Insufficient documentation

## 2019-10-01 MED ORDER — AMOXICILLIN-POT CLAVULANATE 875-125 MG PO TABS: 1.0000 | ORAL_TABLET | Freq: Two times a day (BID) | ORAL | 0 refills | Status: DC

## 2019-10-01 NOTE — ED Triage Notes (Signed)
Pt reports being bit by dog on calf on 10/21. Worried that there is an infection there now. No open wound noticed.

## 2019-10-01 NOTE — ED Notes (Signed)
Patient verbalizes understanding of discharge instructions. Opportunity for questioning and answers were provided. Armband removed by staff, pt discharged from ED.  

## 2019-10-01 NOTE — Discharge Instructions (Addendum)
Place heat to the area. Take the antibiotics as prescribed.

## 2019-10-01 NOTE — ED Provider Notes (Addendum)
[facility] EMERGENCY DEPARTMENT Provider Note   CSN: [id number] Arrival date & time: 10/01/19  1419    History   Chief Complaint Chief Complaint  Patient presents with  . Wound Check    HPI Paul Key is a 43 y.o. male with medical history significant for diabetes, hyperlipidemia who presents for evaluation of wound check.  Patient states he was bit by a dog to his right calf on 09/09/2019.  He did not have any imaging performed at that time.  Patient states he feels like his right calf has had some swelling since the initial dog bite.  He did not take the abx at dc. He denies fever, chills, decreased range of motion, redness, warmth, drainage, hx. PE, DVT, chest pain, shortness of breath.  He is concerned that he has an infection.  Denies any imaging done at the time of his initial visit and was not placed on antibiotics at discharge.  He is not take anything this for his symptoms.  History obtained from patient and past medical records.  No interpreter is used.     HPI  Past Medical History:  Diagnosis Date  . Appendicitis with abscess   . Diabetes mellitus   . Hyperlipidemia     Patient Active Problem List   Diagnosis Date Noted  . Appendicitis with abscess 07/01/2012    Past Surgical History:  Procedure Laterality Date  . APPENDECTOMY    . LAPAROSCOPIC APPENDECTOMY  08/21/2012   Procedure: APPENDECTOMY LAPAROSCOPIC;  Surgeon: Odis Hollingshead, MD;  Location: [facility];  Service: General;  Laterality: N/A;        Home Medications    Prior to Admission medications   Medication Sig Start Date End Date Taking? Authorizing Provider  amoxicillin-clavulanate (AUGMENTIN) 875-125 MG tablet Take 1 tablet by mouth every 12 (twelve) hours. 10/01/19   Esaiah Wanless A, PA-C  cyclobenzaprine (FLEXERIL) 10 MG tablet Take 1 tablet (10 mg total) by mouth 2 (two) times daily as needed for muscle spasms. Patient not taking: Reported on 09/20/2017 08/05/16    Montine Circle, PA-C  ibuprofen (ADVIL,MOTRIN) 600 MG tablet Take 1 tablet (600 mg total) by mouth every 6 (six) hours as needed. 11/25/16   Shary Decamp, PA-C  methocarbamol (ROBAXIN) 500 MG tablet Take 1 tablet (500 mg total) by mouth 2 (two) times daily. Patient not taking: Reported on 09/20/2017 11/25/16   Shary Decamp, PA-C  naproxen (NAPROSYN) 375 MG tablet Take 1 tablet (375 mg total) by mouth 2 (two) times daily. 01/01/19   Arville Lime, PA-C    Family History No family history on file.  Social History Social History   Tobacco Use  . Smoking status: Never Smoker  . Smokeless tobacco: Never Used  Substance Use Topics  . Alcohol use: No  . Drug use: No     Allergies   Chocolate   Review of Systems Review of Systems  HENT: Negative.   Respiratory: Negative.   Cardiovascular: Negative.   Gastrointestinal: Negative.   Musculoskeletal: Negative for gait problem.       Right calf swelling  Skin: Negative.   Neurological: Negative.   All other systems reviewed and are negative.   Physical Exam Updated Vital Signs BP 115/68   Pulse 66   Temp 98.1 F (36.7 C) (Oral)   Resp 16   Wt 78.2 kg   SpO2 99%   BMI 29.59 kg/m   Physical Exam Vitals signs and nursing note reviewed.  Constitutional:  General: He is not in acute distress.    Appearance: He is well-developed. He is not ill-appearing, toxic-appearing or diaphoretic.  HENT:     Head: Normocephalic and atraumatic.     Nose: Nose normal.     Mouth/Throat:     Mouth: Mucous membranes are moist.     Pharynx: Oropharynx is clear.  Eyes:     Pupils: Pupils are equal, round, and reactive to light.  Neck:     Musculoskeletal: Normal range of motion and neck supple.  Cardiovascular:     Rate and Rhythm: Normal rate and regular rhythm.     Pulses: Normal pulses.          Dorsalis pedis pulses are 2+ on the right side and 2+ on the left side.       Posterior tibial pulses are 2+ on the right side and 2+ on  the left side.     Heart sounds: Normal heart sounds.  Pulmonary:     Effort: Pulmonary effort is normal. No respiratory distress.     Breath sounds: Normal breath sounds.  Abdominal:     General: There is no distension.     Palpations: Abdomen is soft.  Musculoskeletal: Normal range of motion.     Right ankle: Normal.     Left ankle: Normal.     Right lower leg: He exhibits tenderness. He exhibits no bony tenderness, no swelling, no deformity and no laceration. No edema.     Left lower leg: Normal.     Right foot: Normal.     Left foot: Normal.     Comments: Moves all 4 extremities without difficulty.  Patient with minimal tenderness to his posterior right calf.  No appreciable swelling.  Compartments soft  Skin:    General: Skin is warm and dry.     Capillary Refill: Capillary refill takes less than 2 seconds.     Comments: Minimal superficial tenderness to right posterior calf.  He has healed abrasions to his right calf.  No fluctuance, erythema, warmth.  Brisk capillary refill.  He does have 1 cm of what appears to be either induration or some scar tissue to his right calf.  There is no fluctuance.  Neurological:     Mental Status: He is alert.     Comments: Intact sensation to bilateral lower extremities.  Equal strength bilaterally.  Coordination intact.  Gait without difficulty.    ED Treatments / Results  Labs (all labs ordered are listed, but only abnormal results are displayed) Labs Reviewed - No data to display  EKG None  Radiology Dg Tibia/fibula Right  Result Date: 10/01/2019 CLINICAL DATA:  Dog bite 3 weeks ago. Pain. Assess for retained foreign body. EXAM: RIGHT TIBIA AND FIBULA - 2 VIEW COMPARISON:  None. FINDINGS: There is no evidence of fracture or other focal bone lesions. Soft tissues are unremarkable. IMPRESSION: Negative. Electronically Signed   By: Gerome Sam III M.D   On: 10/01/2019 15:26    Procedures Procedures (including critical care time)   Medications Ordered in ED Medications - No data to display  Initial Impression / Assessment and Plan / ED Course  I have reviewed the triage vital signs and the nursing notes.  Pertinent labs & imaging results that were available during my care of the patient were reviewed by me and considered in my medical decision making (see chart for details).  43 year old male appears otherwise well presents for evaluation of wound check.  Patient does not  appear septic or ill. Patient feels like he has had some right calf swelling after he was bit by a dog approximately 3 weeks ago.  I do not see any appreciable swelling on exam.  No prior history of PE, DVT.  He is not tachycardic, tachypneic or hypoxic.  Denies chest pain, shortness of breath.  Ambulatory without difficulty.  Intact sensation.  Equal strength bilaterally. Minimal superficial tenderness to right posterior calf.  He has healed abrasions to his right calf.  No fluctuance, erythema, warmth.  Brisk capillary refill.  He does have 1 cm of what appears to be either induration or some scar tissue to his right calf.  There is no fluctuance.  No obvious hematoma, deep space infection.  I have any imaging performed at his initial evaluation.  Will obtain plain film x-ray to assess for retained foreign body as well as ultrasound to rule out DVT.  I do not see any evidence of obvious large abscess which would require drainage or hematoma.  Compartments soft.   Plain film without evidence of retained foreign body. US negative for DVT  Patient reassess. Requesting ABX at dc. Discussed no evidence of cellulitis on exam. Offered bed side US to assess for abscess which patient declined. Given exam with possible 1 cm area of induration vs scar tissue will dc home with Augmentin. Discussed RICE for symptomatic management.   The patient has been appropriately medically screened and/or stabilized in the ED. I have low suspicion for any other emergent medical  condition which would require further screening, evaluation or treatment in the ED or require inpatient management.        Final Clinical Impressions(s) / ED Diagnoses   Final diagnoses:  Visit for wound check    ED Discharge Orders         Ordered    amoxicillin-clavulanate (AUGMENTIN) 875-125 MG tablet  Every 12 hours     10/01/19 1549           Laysa Kimmey A, PA-C 10/01/19 1551    Taijuan Serviss A, PA-C 10/01/19 1552    Long, Arlyss RepressJoshua G, MD 10/02/19 [phone]

## 2019-10-01 NOTE — Progress Notes (Signed)
Lower extremity venous has been completed.   Preliminary results in CV Proc.   Abram Sander 10/01/2019 3:55 PM

## 2019-12-04 ENCOUNTER — Emergency Department (HOSPITAL_COMMUNITY): Payer: Medicare Other

## 2019-12-04 ENCOUNTER — Inpatient Hospital Stay (HOSPITAL_COMMUNITY)
Admission: EM | Admit: 2019-12-04 | Discharge: 2019-12-05 | DRG: 514 | Disposition: A | Discharge status: Home / Self Care | Payer: Medicare Other | Attending: Family Medicine | Admitting: Family Medicine

## 2019-12-04 ENCOUNTER — Other Ambulatory Visit: Payer: Self-pay

## 2019-12-04 ENCOUNTER — Encounter (HOSPITAL_COMMUNITY): Payer: Self-pay

## 2019-12-04 DIAGNOSIS — Z7984 Long term (current) use of oral hypoglycemic drugs: Secondary | ICD-10-CM

## 2019-12-04 DIAGNOSIS — Z20822 Contact with and (suspected) exposure to covid-19: Secondary | ICD-10-CM | POA: Diagnosis present

## 2019-12-04 DIAGNOSIS — L03011 Cellulitis of right finger: Secondary | ICD-10-CM

## 2019-12-04 DIAGNOSIS — M009 Pyogenic arthritis, unspecified: Secondary | ICD-10-CM | POA: Diagnosis present

## 2019-12-04 DIAGNOSIS — Z79899 Other long term (current) drug therapy: Secondary | ICD-10-CM | POA: Diagnosis not present

## 2019-12-04 DIAGNOSIS — E785 Hyperlipidemia, unspecified: Secondary | ICD-10-CM | POA: Diagnosis present

## 2019-12-04 DIAGNOSIS — Z91018 Allergy to other foods: Secondary | ICD-10-CM | POA: Diagnosis not present

## 2019-12-04 DIAGNOSIS — E119 Type 2 diabetes mellitus without complications: Secondary | ICD-10-CM | POA: Diagnosis present

## 2019-12-04 LAB — COMPREHENSIVE METABOLIC PANEL
ALT: 14 U/L (ref 0–44)
AST: 18 U/L (ref 15–41)
Albumin: 3.7 g/dL (ref 3.5–5.0)
Alkaline Phosphatase: 44 U/L (ref 38–126)
Anion gap: 10 (ref 5–15)
BUN: 15 mg/dL (ref 6–20)
CO2: 30 mmol/L (ref 22–32)
Calcium: 9.3 mg/dL (ref 8.9–10.3)
Chloride: 101 mmol/L (ref 98–111)
Creatinine, Ser: 1.07 mg/dL (ref 0.61–1.24)
GFR calc Af Amer: >60 mL/min (ref >60)
GFR calc non Af Amer: >60 mL/min (ref >60)
Glucose, Bld: 129 mg/dL — ABNORMAL HIGH (ref 70–99)
Potassium: 3.5 mmol/L (ref 3.5–5.1)
Sodium: 141 mmol/L (ref 135–145)
Total Bilirubin: 0.7 mg/dL (ref 0.3–1.2)
Total Protein: 6.9 g/dL (ref 6.5–8.1)

## 2019-12-04 LAB — CBC
HCT: 38.3 % — ABNORMAL LOW (ref 39.0–52.0)
Hemoglobin: 12.9 g/dL — ABNORMAL LOW (ref 13.0–17.0)
MCH: 27.2 pg (ref 26.0–34.0)
MCHC: 33.7 g/dL (ref 30.0–36.0)
MCV: 80.6 fL (ref 80.0–100.0)
Platelets: 326 10*3/uL (ref 150–400)
RBC: 4.75 MIL/uL (ref 4.22–5.81)
RDW: 14.5 % (ref 11.5–15.5)
WBC: 6.2 10*3/uL (ref 4.0–10.5)
nRBC: 0 % (ref 0.0–0.2)

## 2019-12-04 LAB — C-REACTIVE PROTEIN: CRP: <0.5 mg/dL (ref <1.0)

## 2019-12-04 LAB — SARS CORONAVIRUS 2 (TAT 6-24 HRS): SARS Coronavirus 2: NEGATIVE

## 2019-12-04 LAB — SEDIMENTATION RATE: Sed Rate: 14 mm/hr (ref 0–16)

## 2019-12-04 LAB — CBG MONITORING, ED: Glucose-Capillary: 123 mg/dL — ABNORMAL HIGH (ref 70–99)

## 2019-12-04 MED ORDER — IBUPROFEN 600 MG PO TABS: 600.0000 mg | ORAL_TABLET | Freq: Four times a day (QID) | ORAL | Status: DC | PRN

## 2019-12-04 MED ORDER — ACETAMINOPHEN 325 MG PO TABS: 650.0000 mg | ORAL_TABLET | Freq: Four times a day (QID) | ORAL | Status: DC | PRN

## 2019-12-04 MED ORDER — BACID PO TABS
2.0000 | ORAL_TABLET | Freq: Three times a day (TID) | ORAL | Status: DC
  Administered 2019-12-04 – 2019-12-05 (×3): 2 via ORAL
  Filled 2019-12-04 (×6): qty 2

## 2019-12-04 MED ORDER — NICOTINE 21 MG/24HR TD PT24
21.0000 mg | MEDICATED_PATCH | Freq: Every day | TRANSDERMAL | Status: DC
  Administered 2019-12-04 – 2019-12-05 (×2): 21 mg via TRANSDERMAL
  Filled 2019-12-04 (×2): qty 1

## 2019-12-04 MED ORDER — MELOXICAM 7.5 MG PO TABS
15.0000 mg | ORAL_TABLET | Freq: Every day | ORAL | Status: DC
  Administered 2019-12-04: 17:00:00 15 mg via ORAL
  Filled 2019-12-04: qty 2

## 2019-12-04 MED ORDER — AMOXICILLIN-POT CLAVULANATE 875-125 MG PO TABS
1.0000 | ORAL_TABLET | Freq: Two times a day (BID) | ORAL | Status: DC
  Administered 2019-12-04 – 2019-12-05 (×2): 1 via ORAL
  Filled 2019-12-04 (×2): qty 1

## 2019-12-04 MED ORDER — BUPIVACAINE HCL 0.5 % IJ SOLN
10.0000 mL | Freq: Once | INTRAMUSCULAR | Status: AC
  Administered 2019-12-04: 15:00:00 10 mL
  Filled 2019-12-04: qty 10

## 2019-12-04 MED ORDER — METFORMIN HCL 500 MG PO TABS
500.0000 mg | ORAL_TABLET | Freq: Every day | ORAL | Status: DC
  Administered 2019-12-05: 09:00:00 500 mg via ORAL
  Filled 2019-12-04: qty 1

## 2019-12-04 MED ORDER — CLINDAMYCIN PHOSPHATE 600 MG/50ML IV SOLN
600.0000 mg | Freq: Once | INTRAVENOUS | Status: AC
  Administered 2019-12-04: 16:00:00 600 mg via INTRAVENOUS
  Filled 2019-12-04: qty 50

## 2019-12-04 MED ORDER — LINEZOLID 600 MG PO TABS
600.0000 mg | ORAL_TABLET | Freq: Two times a day (BID) | ORAL | Status: DC
  Administered 2019-12-04 – 2019-12-05 (×3): 600 mg via ORAL
  Filled 2019-12-04 (×4): qty 1

## 2019-12-04 MED ORDER — ACETAMINOPHEN 650 MG RE SUPP: 650.0000 mg | Freq: Four times a day (QID) | RECTAL | Status: DC | PRN

## 2019-12-04 MED ORDER — ACETAMINOPHEN 325 MG PO TABS
650.0000 mg | ORAL_TABLET | Freq: Four times a day (QID) | ORAL | Status: DC | PRN
  Administered 2019-12-04: 23:00:00 650 mg via ORAL
  Filled 2019-12-04: qty 2

## 2019-12-04 MED ORDER — PRAVASTATIN SODIUM 40 MG PO TABS
40.0000 mg | ORAL_TABLET | Freq: Every day | ORAL | Status: DC
  Administered 2019-12-04: 22:00:00 40 mg via ORAL
  Filled 2019-12-04: qty 1

## 2019-12-04 MED ORDER — FLAX SEED OIL 1000 MG PO CAPS: 1000.0000 mg | ORAL_CAPSULE | Freq: Every day | ORAL | Status: DC

## 2019-12-04 NOTE — ED Notes (Signed)
Tried to give report RN not available at this time.

## 2019-12-04 NOTE — ED Provider Notes (Signed)
[facility] EMERGENCY DEPARTMENT Provider Note   CSN: [id number] Arrival date & time: 12/04/19  1251     History Chief Complaint  Patient presents with  . Hand Pain    Paul Key is a 44 y.o. male.  44 year old male presents with complaint of pain and swelling in his right third finger.  Patient is a diabetic, states he is ambidextrous.  Patient was using a can opener approximately 3 weeks ago when he cut his right third PIP.  Patient has been monitoring the wound and reports gradually worsening pain and swelling in the finger.  Denies any drainage from the injury.  Patient denies any limits in range of motion immediately following the injury that he can recall.  Pain is worse with flexion or extension of the finger at the PIP with pain in the proximal and middle phalanx.  Tetanus is up-to-date (less than 5 years ago).  No other injuries or concerns.        Past Medical History:  Diagnosis Date  . Appendicitis with abscess   . Diabetes mellitus   . Hyperlipidemia     Patient Active Problem List   Diagnosis Date Noted  . Appendicitis with abscess 07/01/2012    Past Surgical History:  Procedure Laterality Date  . APPENDECTOMY    . LAPAROSCOPIC APPENDECTOMY  08/21/2012   Procedure: APPENDECTOMY LAPAROSCOPIC;  Surgeon: Adolph Pollack, MD;  Location: [facility];  Service: General;  Laterality: N/A;       History reviewed. No pertinent family history.  Social History   Tobacco Use  . Smoking status: Never Smoker  . Smokeless tobacco: Never Used  Substance Use Topics  . Alcohol use: No  . Drug use: No    Home Medications Prior to Admission medications   Medication Sig Start Date End Date Taking? Authorizing Provider  acetaminophen (TYLENOL) 500 MG tablet Take 1,000 mg by mouth every 6 (six) hours as needed for mild pain.   Yes [provider]  Flaxseed, Linseed, (FLAX SEED OIL) 1000 MG CAPS Take 1,000 mg by mouth daily.   Yes [provider]  metFORMIN (GLUCOPHAGE) 500 MG tablet Take 500 mg by mouth daily. 11/16/19  Yes [provider]  naproxen sodium (ALEVE) 220 MG tablet Take 220 mg by mouth as needed (pain).   Yes [provider]  pravastatin (PRAVACHOL) 40 MG tablet Take 40 mg by mouth at bedtime. 11/16/19  Yes [provider]  amoxicillin-clavulanate (AUGMENTIN) 875-125 MG tablet Take 1 tablet by mouth every 12 (twelve) hours. Patient not taking: Reported on 12/04/2019 10/01/19   Henderly, Britni A, PA-C  cyclobenzaprine (FLEXERIL) 10 MG tablet Take 1 tablet (10 mg total) by mouth 2 (two) times daily as needed for muscle spasms. Patient not taking: Reported on 09/20/2017 08/05/16   Roxy Horseman, PA-C  ibuprofen (ADVIL,MOTRIN) 600 MG tablet Take 1 tablet (600 mg total) by mouth every 6 (six) hours as needed. Patient not taking: Reported on 12/04/2019 11/25/16   [name], PA-C  methocarbamol (ROBAXIN) 500 MG tablet Take 1 tablet (500 mg total) by mouth 2 (two) times daily. Patient not taking: Reported on 09/20/2017 11/25/16   [name], PA-C  naproxen (NAPROSYN) 375 MG tablet Take 1 tablet (375 mg total) by mouth 2 (two) times daily. Patient not taking: Reported on 12/04/2019 01/01/19   Leretha Dykes, PA-C    Allergies    Chocolate  Review of Systems   Review of Systems  Constitutional: Negative for  fever.  Musculoskeletal: Positive for arthralgias, joint swelling and myalgias.  Skin: Positive for wound.  Allergic/Immunologic: Positive for immunocompromised state.  Neurological: Negative for numbness.  Psychiatric/Behavioral: Negative for confusion.  All other systems reviewed and are negative.   Physical Exam Updated Vital Signs BP 130/75   Pulse 63   Temp 98.5 F (36.9 C) (Oral)   Resp 16   Ht 5\' 4"  (1.626 m)   Wt 74 kg   SpO2 98%   BMI 28.00 kg/m   Physical Exam Vitals and nursing note reviewed.  Constitutional:      General: He is not in acute distress.     Appearance: He is well-developed. He is not diaphoretic.  HENT:     Head: Normocephalic and atraumatic.  Pulmonary:     Effort: Pulmonary effort is normal.  Musculoskeletal:        General: Swelling and tenderness present. No deformity.     Right hand: Swelling, tenderness and bony tenderness present. No deformity. Decreased range of motion. Normal sensation. Normal capillary refill.       Arms:  Skin:    General: Skin is warm and dry.     Findings: Erythema present.  Neurological:     Mental Status: He is alert and oriented to person, place, and time.     Sensory: No sensory deficit.  Psychiatric:        Behavior: Behavior normal.    Media Information   Document Information  Photos    12/04/2019 14:44  Attached To:  Hospital Encounter on 12/04/19  Source Information  [facility]  Media Information   Document Information  Photos    12/04/2019 14:44  Attached To:  Hospital Encounter on 12/04/19  Source Information  [facility]     ED Results / Procedures / Treatments   Labs (all labs ordered are listed, but only abnormal results are displayed) Labs Reviewed  CBC - Abnormal; Notable for the following components:      Result Value   Hemoglobin 12.9 (*)    HCT 38.3 (*)    All other components within normal limits  COMPREHENSIVE METABOLIC PANEL - Abnormal; Notable for the following components:   Glucose, Bld 129 (*)    All other components within normal limits  CBG MONITORING, ED - Abnormal; Notable for the following components:   Glucose-Capillary 123 (*)    All other components within normal limits  SARS CORONAVIRUS 2 (TAT 6-24 HRS)  AEROBIC CULTURE (SUPERFICIAL SPECIMEN)  ANAEROBIC CULTURE  SEDIMENTATION RATE  C-REACTIVE PROTEIN    EKG None  Radiology DG Hand Complete Right  Result Date: 12/04/2019 CLINICAL DATA:  Soft tissue swelling. Laceration injury third digit approximately 2 weeks  prior EXAM: RIGHT HAND - COMPLETE 3+ VIEW COMPARISON:  None. FINDINGS: Frontal, oblique, and lateral views obtained. There is generalized soft tissue swelling of the third digit. No soft tissue air or radiopaque foreign body. No fracture or dislocation. No bony destruction or erosion. Joint spaces appear normal. IMPRESSION: Fairly diffuse soft tissue swelling of the third digit with soft tissue swelling most notably in the proximal and mid portions. No radiopaque foreign body or soft tissue air. No bony abnormality. No appreciable arthropathy. Electronically Signed   By: Lowella Grip III M.D.   On: 12/04/2019 13:27    Procedures Procedures (including critical care time)  Medications Ordered in ED Medications  clindamycin (CLEOCIN) IVPB 600 mg (has no administration in time  range)  bupivacaine (MARCAINE) 0.5 % (with pres) injection 10 mL (10 mLs Infiltration Given 12/04/19 1528)    ED Course  I have reviewed the triage vital signs and the nursing notes.  Pertinent labs & imaging results that were available during my care of the patient were reviewed by me and considered in my medical decision making (see chart for details).  Clinical Course as of Dec 03 1612  Fri Dec 04, 2019  1522 43yo male with right 3rd finger pain and swelling after cut to the PIP 3 weeks ago. Swelling and tenderness on exam, warm to the touch without drainage or streaking. Td UTD. Discussed with Charma Igo, PA-C with ortho, plan is to admit to medicine service for IV antibiotics, Dr. Janee Morn will plan to I&D finger.   [LM]  1524 CBC unremarkable, CMP without significant findings, x-ray with soft tissue swelling, no bony changes.  Add on sed rate and CRP.  Add on Covid test for hospital admission.   [LM]  1525 Case discussed with Dr. Chipper Herb with [facility] hospitalist who will consult for admission.   [LM]    Clinical Course User Index [LM] Alden Hipp   MDM Rules/Calculators/A&P                       Final Clinical Impression(s) / ED Diagnoses Final diagnoses:  Finger infection    Rx / DC Orders ED Discharge Orders    None       Jeannie Fend, PA-C 12/04/19 1614    Wynetta Fines, MD 12/07/19 1501

## 2019-12-04 NOTE — ED Notes (Signed)
ED TO INPATIENT HANDOFF REPORT  ED Nurse Name and Phone #: Britta Mccreedy RN  S Name/Age/Gender Paul Key 44 y.o. male Room/Bed: 001C/001C  Code Status   Code Status: Full Code  Home/SNF/Other Home Patient oriented to: self, place, time and situation Is this baseline? Yes   Triage Complete: Triage complete  Chief Complaint Septic arthritis (HCC) [M00.9]  Triage Note Pt arrives POV for eval of R middle finger pain after cutting it on a can opener 2 weeks ago. Pt reports he is a diabetic. R middle finger is significantly swollen, no drainage noted, limited movement d/t swelling. Denies fevers/chills at home     Allergies Allergies  Allergen Reactions  . Chocolate Rash    Level of Care/Admitting Diagnosis ED Disposition    ED Disposition Condition Comment   Admit  Hospital Area: [facility] [[id number]]  Level of Care: Med-Surg [16]  Covid Evaluation: Asymptomatic Screening Protocol (No Symptoms)  Diagnosis: Septic arthritis [facility]) [250539]  Admitting Physician: Emeline General [[id number]]  Attending Physician: [facility] General [[id number]]  Estimated length of stay: past midnight tomorrow  Certification:: I certify this patient will need inpatient services for at least 2 midnights       B Medical/Surgery History Past Medical History:  Diagnosis Date  . Appendicitis with abscess   . Diabetes mellitus   . Hyperlipidemia    Past Surgical History:  Procedure Laterality Date  . APPENDECTOMY    . LAPAROSCOPIC APPENDECTOMY  08/21/2012   Procedure: APPENDECTOMY LAPAROSCOPIC;  Surgeon: Adolph Pollack, MD;  Location: [facility];  Service: General;  Laterality: N/A;     A IV Location/Drains/Wounds Patient Lines/Drains/Airways Status   Active Line/Drains/Airways    Name:   Placement date:   Placement time:   Site:   Days:   Peripheral IV 12/04/19 Left Antecubital   12/04/19    1517    Antecubital   less than 1          Intake/Output Last 24  hours  Intake/Output Summary (Last 24 hours) at 12/04/2019 1853 Last data filed at 12/04/2019 1659 Gross per 24 hour  Intake 100 ml  Output --  Net 100 ml    Labs/Imaging Results for orders placed or performed during the hospital encounter of 12/04/19 (from the past 48 hour(s))  CBC     Status: Abnormal   Collection Time: 12/04/19  1:03 PM  Result Value Ref Range   WBC 6.2 4.0 - 10.5 K/uL   RBC 4.75 4.22 - 5.81 MIL/uL   Hemoglobin 12.9 (L) 13.0 - 17.0 g/dL   HCT 97.3 (L) 53.2 - 99.2 %   MCV 80.6 80.0 - 100.0 fL   MCH 27.2 26.0 - 34.0 pg   MCHC 33.7 30.0 - 36.0 g/dL   RDW 42.6 83.4 - 19.6 %   Platelets 326 150 - 400 K/uL   nRBC 0.0 0.0 - 0.2 %    Comment: Performed at [facility], [address], [place], [place] [address]  Comprehensive metabolic panel     Status: Abnormal   Collection Time: 12/04/19  1:03 PM  Result Value Ref Range   Sodium 141 135 - 145 mmol/L   Potassium 3.5 3.5 - 5.1 mmol/L   Chloride 101 98 - 111 mmol/L   CO2 30 22 - 32 mmol/L   Glucose, Bld 129 (H) 70 - 99 mg/dL   BUN 15 6 - 20 mg/dL   Creatinine, Ser 9.89 0.61 - 1.24 mg/dL  Calcium 9.3 8.9 - 10.3 mg/dL   Total Protein 6.9 6.5 - 8.1 g/dL   Albumin 3.7 3.5 - 5.0 g/dL   AST 18 15 - 41 U/L   ALT 14 0 - 44 U/L   Alkaline Phosphatase 44 38 - 126 U/L   Total Bilirubin 0.7 0.3 - 1.2 mg/dL   GFR calc non Af Amer >60 >60 mL/min   GFR calc Af Amer >60 >60 mL/min   Anion gap 10 5 - 15    Comment: Performed at [facility] [address], [place], [place] [address]  CBG monitoring, ED     Status: Abnormal   Collection Time: 12/04/19  1:03 PM  Result Value Ref Range   Glucose-Capillary 123 (H) 70 - 99 mg/dL  Sedimentation rate     Status: None   Collection Time: 12/04/19  3:09 PM  Result Value Ref Range   Sed Rate 14 0 - 16 mm/hr    Comment: Performed at [facility], [place] [address], [place], [place] [address]  C-reactive protein     Status: None   Collection Time: 12/04/19  3:22  PM  Result Value Ref Range   CRP <0.5 <1.0 mg/dL    Comment: Performed at [facility], [place] [address] [place], [place] [address]   DG Hand Complete Right  Result Date: 12/04/2019 CLINICAL DATA:  Soft tissue swelling. Laceration injury third digit approximately 2 weeks prior EXAM: RIGHT HAND - COMPLETE 3+ VIEW COMPARISON:  None. FINDINGS: Frontal, oblique, and lateral views obtained. There is generalized soft tissue swelling of the third digit. No soft tissue air or radiopaque foreign body. No fracture or dislocation. No bony destruction or erosion. Joint spaces appear normal. IMPRESSION: Fairly diffuse soft tissue swelling of the third digit with soft tissue swelling most notably in the proximal and mid portions. No radiopaque foreign body or soft tissue air. No bony abnormality. No appreciable arthropathy. Electronically Signed   By: Lowella Grip III M.D.   On: 12/04/2019 13:27    Pending Labs Unresulted Labs (From admission, onward)    Start     Ordered   12/05/19 0500  CBC  Tomorrow morning,   R     12/04/19 1644   12/04/19 1643  HIV Antibody (routine testing w rflx)  (HIV Antibody (Routine testing w reflex) panel)  Once,   STAT     12/04/19 1644   12/04/19 1555  Aerobic Culture (superficial specimen)  Once,   STAT     12/04/19 1555   12/04/19 1510  SARS CORONAVIRUS 2 (TAT 6-24 HRS) Nasopharyngeal Nasopharyngeal Swab  (Tier 3 (TAT 6-24 hrs))  Once,   STAT    Question Answer Comment  Is this test for diagnosis or screening Screening   Symptomatic for COVID-19 as defined by CDC No   Hospitalized for COVID-19 No   Admitted to ICU for COVID-19 No   Previously tested for COVID-19 No   Resident in a congregate (group) care setting No   Employed in healthcare setting No      12/04/19 1510          Vitals/Pain Today's Vitals   12/04/19 1255 12/04/19 1300 12/04/19 1301  BP: 130/75    Pulse: 63    Resp: 16    Temp: 98.5 F (36.9 C)    TempSrc: Oral    SpO2: 98%     Weight:   74 kg  Height:   5\' 4"  (1.626 m)  PainSc:  10-Worst pain ever     Isolation Precautions No active isolations  Medications Medications  meloxicam (MOBIC) tablet 15 mg (15 mg Oral Given 12/04/19 1707)  pravastatin (PRAVACHOL) tablet 40 mg (has no administration in time range)  metFORMIN (GLUCOPHAGE) tablet 500 mg (has no administration in time range)  ibuprofen (ADVIL) tablet 600 mg (has no administration in time range)  lactobacillus acidophilus (BACID) tablet 2 tablet (2 tablets Oral Not Given 12/04/19 1806)  acetaminophen (TYLENOL) tablet 650 mg (has no administration in time range)  linezolid (ZYVOX) tablet 600 mg (has no administration in time range)  amoxicillin-clavulanate (AUGMENTIN) 875-125 MG per tablet 1 tablet (has no administration in time range)  nicotine (NICODERM CQ - dosed in mg/24 hours) patch 21 mg (21 mg Transdermal Patch Applied 12/04/19 1805)  bupivacaine (MARCAINE) 0.5 % (with pres) injection 10 mL (10 mLs Infiltration Given 12/04/19 1528)  clindamycin (CLEOCIN) IVPB 600 mg (0 mg Intravenous Stopped 12/04/19 1659)    Mobility walks     Focused Assessments ortho   R Recommendations: See Admitting Provider Note  Report given to:   Additional Notes:

## 2019-12-04 NOTE — ED Triage Notes (Signed)
Pt arrives POV for eval of R middle finger pain after cutting it on a can opener 2 weeks ago. Pt reports he is a diabetic. R middle finger is significantly swollen, no drainage noted, limited movement d/t swelling. Denies fevers/chills at home

## 2019-12-04 NOTE — ED Notes (Signed)
Pt agitated that he has to be admitted, has attempted to go out to smoke.   Nicotine patch placed, and pt will not change into a gown at this time.  Does state that he does not want to be here.

## 2019-12-04 NOTE — Consult Note (Signed)
[facility]    Date of Admission:  12/04/2019          Reason for Consult: Right long finger infection    Referring Provider: Dr. Mikey College  Assessment: I think it is reasonable to discharge him home on oral linezolid and oral amoxicillin clavulanate.  This will treat him broadly for staph, strep, gram-negative rods and anaerobes while awaiting culture results.  These agents have good bioavailability and should suffice for joint infection.  I will plan on seeing him back in clinic within 1 week.  Plan: 1. Discharged home with scripts for a 3-week supply of linezolid 600 mg twice daily and amoxicillin clavulanate 875 mg twice daily 2. Follow-up in my clinic on Thursday, 12/10/2019 at 4:15 PM  Active Problems:   Septic arthritis (HCC)   Diabetes mellitus (HCC)   Dyslipidemia   Scheduled Meds: . amoxicillin-clavulanate  1 tablet Oral Q12H  . lactobacillus acidophilus  2 tablet Oral TID  . linezolid  600 mg Oral Q12H  . meloxicam  15 mg Oral Daily  . [START ON 12/05/2019] metFORMIN  500 mg Oral Daily  . pravastatin  40 mg Oral QHS   Continuous Infusions: PRN Meds:.acetaminophen, ibuprofen  HPI: Paul Key is a 44 y.o. male who cut the dorsal surface of his right long finger while using a can opener to open a can about 2 weeks ago.  The cut bled freely.  He cleaned and bandaged the cut but over the next week he had increasing pain and swelling.  He says that he finally decided to come to the emergency department today.  He works doing yard work in Aeronautical engineer.  He is ambidextrous.  He was seen by Dr. Mack Hook in the ED today who did a bedside I&D.  He expressed concern that the PIP joint may be infected.  Specimens have been sent to the lab but Gram stain and culture are pending.  Paul Key lives alone here in [place].  He has underlying diabetes and hyperlipidemia for which he takes Metformin and pravastatin.   Review of Systems: Review  of Systems  Constitutional: Negative for chills and fever.  Gastrointestinal: Negative for nausea and vomiting.  Musculoskeletal: Positive for joint pain.  Psychiatric/Behavioral: Negative for substance abuse.    Past Medical History:  Diagnosis Date  . Appendicitis with abscess   . Diabetes mellitus   . Hyperlipidemia     Social History   Tobacco Use  . Smoking status: Never Smoker  . Smokeless tobacco: Never Used  Substance Use Topics  . Alcohol use: No  . Drug use: No    History reviewed. No pertinent family history. Allergies  Allergen Reactions  . Chocolate Rash    OBJECTIVE: Blood pressure 130/75, pulse 63, temperature 98.5 F (36.9 C), temperature source Oral, resp. rate 16, height 5\' 4"  (1.626 m), weight 74 kg, SpO2 98 %.  Physical Exam Constitutional:      Comments: He is frustrated about being in the emergency department.  He got upset about having to undergo nasal swabbing for Covid testing.  He is hungry and wants to go home.  Musculoskeletal:     Comments: His right long finger is in a surgical dressing.     Lab Results Lab Results  Component Value Date   WBC 6.2 12/04/2019   HGB 12.9 (L) 12/04/2019   HCT 38.3 (L) 12/04/2019   MCV 80.6 12/04/2019   PLT 326  12/04/2019    Lab Results  Component Value Date   CREATININE 1.07 12/04/2019   BUN 15 12/04/2019   NA 141 12/04/2019   K 3.5 12/04/2019   CL 101 12/04/2019   CO2 30 12/04/2019    Lab Results  Component Value Date   ALT 14 12/04/2019   AST 18 12/04/2019   ALKPHOS 44 12/04/2019   BILITOT 0.7 12/04/2019     Microbiology: No results found for this or any previous visit (from the past 240 hour(s)).  Michel Bickers, MD [facility] [phone] pager   [phone] cell 12/04/2019, 5:04 PM

## 2019-12-04 NOTE — Consult Note (Addendum)
Reason for Consult:Right long finger infection Referring Physician: P Messick  Paul Key is an 44 y.o. male.  HPI: Paul Key cut his right long finger while opening a can about 2.5 weeks ago. It swelled up pretty quickly and stayed that way and painful. He finally couldn't put up with it anymore and came to the ED for evaluation. Worry was for septic arthritis and hand surgery was consulted. He is ambidextrous and works in Biomedical scientist.  Past Medical History:  Diagnosis Date  . Appendicitis with abscess   . Diabetes mellitus   . Hyperlipidemia     Past Surgical History:  Procedure Laterality Date  . APPENDECTOMY    . LAPAROSCOPIC APPENDECTOMY  08/21/2012   Procedure: APPENDECTOMY LAPAROSCOPIC;  Surgeon: Odis Hollingshead, MD;  Location: [facility];  Service: General;  Laterality: N/A;    History reviewed. No pertinent family history.  Social History:  reports that he has never smoked. He has never used smokeless tobacco. He reports that he does not drink alcohol or use drugs.  Allergies:  Allergies  Allergen Reactions  . Chocolate Rash    Medications: I have reviewed the patient's current medications.  Results for orders placed or performed during the hospital encounter of 12/04/19 (from the past 48 hour(s))  CBC     Status: Abnormal   Collection Time: 12/04/19  1:03 PM  Result Value Ref Range   WBC 6.2 4.0 - 10.5 K/uL   RBC 4.75 4.22 - 5.81 MIL/uL   Hemoglobin 12.9 (L) 13.0 - 17.0 g/dL   HCT 38.3 (L) 39.0 - 52.0 %   MCV 80.6 80.0 - 100.0 fL   MCH 27.2 26.0 - 34.0 pg   MCHC 33.7 30.0 - 36.0 g/dL   RDW 14.5 11.5 - 15.5 %   Platelets 326 150 - 400 K/uL   nRBC 0.0 0.0 - 0.2 %    Comment: Performed at [facility], [place] [address] [place], [place] [address]  Comprehensive metabolic panel     Status: Abnormal   Collection Time: 12/04/19  1:03 PM  Result Value Ref Range   Sodium 141 135 - 145 mmol/L   Potassium 3.5 3.5 - 5.1 mmol/L   Chloride 101 98 - 111 mmol/L   CO2 30  22 - 32 mmol/L   Glucose, Bld 129 (H) 70 - 99 mg/dL   BUN 15 6 - 20 mg/dL   Creatinine, Ser 1.07 0.61 - 1.24 mg/dL   Calcium 9.3 8.9 - 10.3 mg/dL   Total Protein 6.9 6.5 - 8.1 g/dL   Albumin 3.7 3.5 - 5.0 g/dL   AST 18 15 - 41 U/L   ALT 14 0 - 44 U/L   Alkaline Phosphatase 44 38 - 126 U/L   Total Bilirubin 0.7 0.3 - 1.2 mg/dL   GFR calc non Af Amer >60 >60 mL/min   GFR calc Af Amer >60 >60 mL/min   Anion gap 10 5 - 15    Comment: Performed at [facility] [address] [place],  [address]  CBG monitoring, ED     Status: Abnormal   Collection Time: 12/04/19  1:03 PM  Result Value Ref Range   Glucose-Capillary 123 (H) 70 - 99 mg/dL    DG Hand Complete Right  Result Date: 12/04/2019 CLINICAL DATA:  Soft tissue swelling. Laceration injury third digit approximately 2 weeks prior EXAM: RIGHT HAND - COMPLETE 3+ VIEW COMPARISON:  None. FINDINGS: Frontal, oblique, and lateral views obtained. There is generalized  soft tissue swelling of the third digit. No soft tissue air or radiopaque foreign body. No fracture or dislocation. No bony destruction or erosion. Joint spaces appear normal. IMPRESSION: Fairly diffuse soft tissue swelling of the third digit with soft tissue swelling most notably in the proximal and mid portions. No radiopaque foreign body or soft tissue air. No bony abnormality. No appreciable arthropathy. Electronically Signed   By: Lowella Grip III M.D.   On: 12/04/2019 13:27    Review of Systems  Constitutional: Negative for chills, diaphoresis and fever.  HENT: Negative for ear discharge, ear pain, hearing loss and tinnitus.   Eyes: Negative for photophobia and pain.  Respiratory: Negative for cough and shortness of breath.   Cardiovascular: Negative for chest pain.  Gastrointestinal: Negative for abdominal pain, nausea and vomiting.  Genitourinary: Negative for dysuria, flank pain, frequency and urgency.  Musculoskeletal: Positive for arthralgias (Right  middle finger). Negative for back pain, myalgias and neck pain.  Neurological: Negative for dizziness and headaches.  Hematological: Does not bruise/bleed easily.  Psychiatric/Behavioral: The patient is not nervous/anxious.   All other systems reviewed and are negative.  Blood pressure 130/75, pulse 63, temperature 98.5 F (36.9 C), temperature source Oral, resp. rate 16, height _0  (1.626 m), weight 74 kg, SpO2 98 %. Physical Exam  Constitutional: He appears well-developed and well-nourished. No distress.  HENT:  Head: Normocephalic and atraumatic.  Eyes: Conjunctivae are normal. Right eye exhibits no discharge. Left eye exhibits no discharge. No scleral icterus.  Cardiovascular: Normal rate and regular rhythm.  Respiratory: Effort normal. No respiratory distress.  Musculoskeletal:     Cervical back: Normal range of motion.     Comments: Right shoulder, elbow, wrist, digits- no skin wounds, long finger fusiform edema P2, mild TTP, severe pain with AROM/PROM PIP joint, no instability, no blocks to motion  Sens  Ax/R/M/U intact  Mot   Ax/ R/ PIN/ M/ AIN/ U intact  Rad 2+  Neurological: He is alert.  Skin: Skin is warm and dry. He is not diaphoretic.  Psychiatric: He has a normal mood and affect. His behavior is normal.    Assessment/Plan: Right long finger PIP joint septic arthritis -- I&D performed at bedside by Dr. Grandville Silos. Will need medical evaluation with consideration for parenteral abx in conjuction with ID.  DM    Lisette Abu, PA-C Orthopedic Surgery [phone] 12/04/2019, 3:50 PM   Asked by EDP to assist with care for this diabetic patient with suspected right long finger PIP septic arthritis, resulting from a dorsal laceration at the level of the PIP joint that occurred about 2 weeks ago opening a can of canned food.    Right long finger was found to be mildly to moderately swollen, centered at the level of the PIP joint, with a healed half inch transverse  dorsal laceration.  The wound was no longer open, and thus there was no expressible drainage.  The digit was held in extension.  Both AROM and PROM at the PIP joint were painful, making clinical evaluation of the integrity of the central slip difficult.  WBC 6.2.  Plain radiographs not yet showing evidence for skeletal infection.  Probable right long finger septic arthritis of the PIP joint  I discussed these findings with the patient and recommended bedside surgical debridement under digital block.  He consented.    Procedure: After performing a digital block with plain Marcaine, a ring tourniquet was applied to the base of the digit.  Once an adequate  degree of anesthesia had been obtained, the desquamating epidermis was peeled.  The previous laceration was reopened, and extended proximally on its radial side, distally on the ulnar side to allow for flaps that could be retracted.  There was no frank purulence, but there was some serous fluid and an obvious breach in the extensor mechanism, involving the ulnar portion of the central slip.  Culture swabs were obtained and sent to microbiology.  The traumatic rent in the extensor apparatus was exploited into a broader longitudinal division and the extensor apparatus.  There was some mucoid appearing soft tissues that were excisionally debrided.  There was similar type tissue deep to the extensor apparatus that was also excised.  Through a dorsal ulnar arthrotomy, the joint was inspected.  The cartilage was in reasonably good condition, and a dorsal capsulectomy was performed, leaving the collaterals intact.  The joint itself was then copiously irrigated as was the remainder of the soft tissues before loosely reapproximating the skin and removing the ring tourniquet.  Soft dressing was applied.  Recommendations: No further acute surgical care is required.  I will plan to have the patient back to my office in 7 to 10 days to evaluate the status of wound  healing, motion of the digit, & any need for ongoing outpatient formal therapy.  I will write orders for wound care instructions.  We also ordered CRP & ESR  With regard to the ongoing and more long-term medical management for this infection, this patient would benefit from infectious disease consultation and management, given that this likely represents septic arthritis and even early osteomyelitis.  Should this infection not resole and require additional surgical debridement and/or amputation, I remain available to assist with these or other aspects of required surgical care.  May d/c from hospital per surgical perspective once appropriate medical infection plan enacted.  Milly Jakob, MD Hand Surgery Mobile [phone]

## 2019-12-04 NOTE — H&P (Signed)
History and Physical    Paul Key FFM:[id number] DOB: 02-Jan-1976 DOA: 12/04/2019  PCP: Charolette Forward, MD   Patient coming from:Home  I have personally briefly reviewed patient's old medical records in [place]  Chief Complaint: Right mid finger pain and swelling  HPI: Paul Key is a 44 y.o. male with medical history significant of IIDM, HLD, whocut his right long finger while opening a can about 2.5 weeks ago. Next day his finger started to swell along with significant pain. He continue to use right hand and only take OTC ibuprofen and Tylenol alternatively for his symptoms but he denies any fever chills. He finally noted to come to ED for evaluation.   ED Course: Orthopedic surgeon was consulted who did a bedside incision and drainage of the right mid finger PIP joint.  Review of Systems: As per HPI otherwise 10 point review of systems negative.    Past Medical History:  Diagnosis Date  . Appendicitis with abscess   . Diabetes mellitus   . Hyperlipidemia     Past Surgical History:  Procedure Laterality Date  . APPENDECTOMY    . LAPAROSCOPIC APPENDECTOMY  08/21/2012   Procedure: APPENDECTOMY LAPAROSCOPIC;  Surgeon: Odis Hollingshead, MD;  Location: [facility];  Service: General;  Laterality: N/A;     reports that he has never smoked. He has never used smokeless tobacco. He reports that he does not drink alcohol or use drugs.  Allergies  Allergen Reactions  . Chocolate Rash    HTN runs in family.   Prior to Admission medications   Medication Sig Start Date End Date Taking? Authorizing Provider  acetaminophen (TYLENOL) 500 MG tablet Take 1,000 mg by mouth every 6 (six) hours as needed for mild pain.   Yes [provider]  Flaxseed, Linseed, (FLAX SEED OIL) 1000 MG CAPS Take 1,000 mg by mouth daily.   Yes [provider]  metFORMIN (GLUCOPHAGE) 500 MG tablet Take 500 mg by mouth daily. 11/16/19  Yes [provider]  naproxen sodium  (ALEVE) 220 MG tablet Take 220 mg by mouth as needed (pain).   Yes [provider]  pravastatin (PRAVACHOL) 40 MG tablet Take 40 mg by mouth at bedtime. 11/16/19  Yes [provider]  amoxicillin-clavulanate (AUGMENTIN) 875-125 MG tablet Take 1 tablet by mouth every 12 (twelve) hours. Patient not taking: Reported on 12/04/2019 10/01/19   Henderly, Britni A, PA-C  cyclobenzaprine (FLEXERIL) 10 MG tablet Take 1 tablet (10 mg total) by mouth 2 (two) times daily as needed for muscle spasms. Patient not taking: Reported on 09/20/2017 08/05/16   Montine Circle, PA-C  ibuprofen (ADVIL,MOTRIN) 600 MG tablet Take 1 tablet (600 mg total) by mouth every 6 (six) hours as needed. Patient not taking: Reported on 12/04/2019 11/25/16   Shary Decamp, PA-C  methocarbamol (ROBAXIN) 500 MG tablet Take 1 tablet (500 mg total) by mouth 2 (two) times daily. Patient not taking: Reported on 09/20/2017 11/25/16   Shary Decamp, PA-C  naproxen (NAPROSYN) 375 MG tablet Take 1 tablet (375 mg total) by mouth 2 (two) times daily. Patient not taking: Reported on 12/04/2019 01/01/19   Arville Lime, [place]    Physical Exam: Vitals:   12/04/19 1255 12/04/19 1301  BP: 130/75   Pulse: 63   Resp: 16   Temp: 98.5 F (36.9 C)   TempSrc: Oral   SpO2: 98%   Weight:  74 kg  Height:  5\' 4"  (1.626 m)    Constitutional: NAD, calm, comfortable  Vitals:   12/04/19 1255 12/04/19 1301  BP: 130/75   Pulse: 63   Resp: 16   Temp: 98.5 F (36.9 C)   TempSrc: Oral   SpO2: 98%   Weight:  74 kg  Height:  5\' 4"  (1.626 m)   Eyes: PERRL, lids and conjunctivae normal ENMT: Mucous membranes are moist. Posterior pharynx clear of any exudate or lesions.Normal dentition.  Neck: normal, supple, no masses, no thyromegaly Respiratory: clear to auscultation bilaterally, no wheezing, no crackles. Normal respiratory effort. No accessory muscle use.  Cardiovascular: Regular rate and rhythm, no murmurs / rubs / gallops. No extremity  edema. 2+ pedal pulses. No carotid bruits.  Abdomen: no tenderness, no masses palpated. No hepatosplenomegaly. Bowel sounds positive.  Musculoskeletal: Right mid finger swelling and tender, significantly reduced ROM. Skin: no rashes, lesions, ulcers. No induration Neurologic: CN 2-12 grossly intact. Sensation intact, DTR normal. Strength 5/5 in all 4.  Psychiatric: Normal judgment and insight. Alert and oriented x 3. Normal mood.         Labs on Admission: I have personally reviewed following labs and imaging studies  CBC: Recent Labs  Lab 12/04/19 1303  WBC 6.2  HGB 12.9*  HCT 38.3*  MCV 80.6  PLT 326   Basic Metabolic Panel: Recent Labs  Lab 12/04/19 1303  NA 141  K 3.5  CL 101  CO2 30  GLUCOSE 129*  BUN 15  CREATININE 1.07  CALCIUM 9.3   GFR: Estimated Creatinine Clearance: 80.3 mL/min (by C-G formula based on SCr of 1.07 mg/dL). Liver Function Tests: Recent Labs  Lab 12/04/19 1303  AST 18  ALT 14  ALKPHOS 44  BILITOT 0.7  PROT 6.9  ALBUMIN 3.7   No results for input(s): LIPASE, AMYLASE in the last 168 hours. No results for input(s): AMMONIA in the last 168 hours. Coagulation Profile: No results for input(s): INR, PROTIME in the last 168 hours. Cardiac Enzymes: No results for input(s): CKTOTAL, CKMB, CKMBINDEX, TROPONINI in the last 168 hours. BNP (last 3 results) No results for input(s): PROBNP in the last 8760 hours. HbA1C: No results for input(s): HGBA1C in the last 72 hours. CBG: Recent Labs  Lab 12/04/19 1303  GLUCAP 123*   Lipid Profile: No results for input(s): CHOL, HDL, LDLCALC, TRIG, CHOLHDL, LDLDIRECT in the last 72 hours. Thyroid Function Tests: No results for input(s): TSH, T4TOTAL, FREET4, T3FREE, THYROIDAB in the last 72 hours. Anemia Panel: No results for input(s): VITAMINB12, FOLATE, FERRITIN, TIBC, IRON, RETICCTPCT in the last 72 hours. Urine analysis:    Component Value Date/Time   COLORURINE YELLOW 06/02/2012 2308    APPEARANCEUR CLEAR 06/02/2012 2308   LABSPEC 1.042 (H) 06/02/2012 2308   PHURINE 5.5 06/02/2012 2308   GLUCOSEU NEGATIVE 06/02/2012 2308   HGBUR NEGATIVE 06/02/2012 2308   BILIRUBINUR NEGATIVE 06/02/2012 2308   KETONESUR 15 (A) 06/02/2012 2308   PROTEINUR 30 (A) 06/02/2012 2308   UROBILINOGEN 2.0 (H) 06/02/2012 2308   NITRITE NEGATIVE 06/02/2012 2308   LEUKOCYTESUR NEGATIVE 06/02/2012 2308    Radiological Exams on Admission: DG Hand Complete Right  Result Date: 12/04/2019 CLINICAL DATA:  Soft tissue swelling. Laceration injury third digit approximately 2 weeks prior EXAM: RIGHT HAND - COMPLETE 3+ VIEW COMPARISON:  None. FINDINGS: Frontal, oblique, and lateral views obtained. There is generalized soft tissue swelling of the third digit. No soft tissue air or radiopaque foreign body. No fracture or dislocation. No bony destruction or erosion. Joint spaces appear normal. IMPRESSION: Fairly diffuse soft tissue swelling  of the third digit with soft tissue swelling most notably in the proximal and mid portions. No radiopaque foreign body or soft tissue air. No bony abnormality. No appreciable arthropathy. Electronically Signed   By: Bretta Bang III M.D.   On: 12/04/2019 13:27    EKG: None  Assessment/Plan Active Problems:   Septic arthritis (HCC)  Septic arthritis of right middle finger PIP, discussed with orthopedic surgeon at bedside recommend parenteral antibiotics and ID consultation. Orthopedic surgeon clears patient to discharge and want to see patient in office in 2 to 3 weeks. Culture sent today. Discussed with infection disease attending Dr. Orvan Falconer over the phone, who after checking patient insurance, recommends p.o. Zyvox plus Augmentin. We'll start patient on those two antibiotics and case manager to work on Designer, jewellery.  IIDM, continue Metformin.  HLD, on Statin.     DVT prophylaxis: SCD and ambulation  code Status: Full Code Family Communication: None  at bedside Disposition Plan: Home Consults called: Ortho and ID. Admission status: Med Surg   Emeline General MD [facility] Pager [phone]  If 7PM-7AM, please contact night-coverage [email or web address] Password [facility]  12/04/2019, 4:48 PM

## 2019-12-05 ENCOUNTER — Other Ambulatory Visit: Payer: Self-pay | Admitting: Family Medicine

## 2019-12-05 DIAGNOSIS — M009 Pyogenic arthritis, unspecified: Secondary | ICD-10-CM

## 2019-12-05 LAB — CBC
HCT: 36.2 % — ABNORMAL LOW (ref 39.0–52.0)
Hemoglobin: 12.4 g/dL — ABNORMAL LOW (ref 13.0–17.0)
MCH: 27.2 pg (ref 26.0–34.0)
MCHC: 34.3 g/dL (ref 30.0–36.0)
MCV: 79.4 fL — ABNORMAL LOW (ref 80.0–100.0)
Platelets: 323 10*3/uL (ref 150–400)
RBC: 4.56 MIL/uL (ref 4.22–5.81)
RDW: 14.5 % (ref 11.5–15.5)
WBC: 6.1 10*3/uL (ref 4.0–10.5)
nRBC: 0 % (ref 0.0–0.2)

## 2019-12-05 LAB — HIV ANTIBODY (ROUTINE TESTING W REFLEX): HIV Screen 4th Generation wRfx: NONREACTIVE

## 2019-12-05 MED ORDER — LINEZOLID 600 MG PO TABS: 600.0000 mg | ORAL_TABLET | Freq: Two times a day (BID) | ORAL | 0 refills | Status: DC

## 2019-12-05 MED ORDER — AMOXICILLIN-POT CLAVULANATE 875-125 MG PO TABS: 1.0000 | ORAL_TABLET | Freq: Two times a day (BID) | ORAL | 0 refills | Status: DC

## 2019-12-05 MED ORDER — NAPROXEN 250 MG PO TABS
500.0000 mg | ORAL_TABLET | Freq: Two times a day (BID) | ORAL | Status: DC
  Administered 2019-12-05: 09:00:00 500 mg via ORAL
  Filled 2019-12-05: qty 2

## 2019-12-05 MED ORDER — BACID PO TABS: 2.0000 | ORAL_TABLET | Freq: Three times a day (TID) | ORAL | 0 refills | Status: AC

## 2019-12-05 NOTE — Discharge Instructions (Signed)
You were admitted for an infection in your hand.  You have been started on two antibiotics---  1. Linezolid--this is twice daily. You should take this for a minimum of 3 weeks.  2. Augmenting- this is also twice daily. You should take this for 3 weeks.  Dr. Orvan Falconer, infectious disease specialist, will see you in 1 week.  If you develop fevers, worsening, or have difficulty obtaining your medication, please let us know. Our case manager worked hard to ensure you have your medication.   To obtain your medication-- please go to the following pharmacy [facility] on [address]  Address: [address] [place], [place] [address] Departments: [facility]  [facility] Photo Hours:  Open 24 hours Pharmacy: Open ? Closes 1:30AM  More hours Phone: [phone]

## 2019-12-05 NOTE — Discharge Summary (Signed)
Physician Discharge Summary  Dahl Higinbotham YCX:[id number] DOB: 19-Sep-1976 DOA: 12/04/2019  PCP: Rinaldo Cloud, MD  Admit date: 12/04/2019 Discharge date: 12/05/2019  Admitted From: Home  Disposition:  Home   Recommendations for Outpatient Follow-up:  1. Follow up with PCP in 1-2 weeks 2. Please obtain BMP/CBC in one week 3. Please follow up on the following pending results: - Wound culture   Home Health: None  Equipment/Devices: None   Discharge Condition: Stable   CODE STATUS: Full  Diet recommendation: Regular   Brief/Interim Summary:  Emanuell Morina is a 44 y.o. male with medical history significant of IIDM, HLD, who cut his right third digit while opening a can about 2.5 weeks prior to admission. He was admitted for suspected septic arthritis. He underwent I/D with Dr. Janee Morn on 1/15. He was seen by ID who recommended 3 week course of linezolid and augmentin. Case management able to obtain medication from [facility] at $0 to patient. He will follow up with Infectious Disease in 1 week and 7-10 days with Dr. Janee Morn. Tdap given in ED.    Discharge Diagnoses:  Active Problems:   Septic arthritis (HCC)   Diabetes mellitus (HCC)   Dyslipidemia    Discharge Instructions  Discharge Instructions    Call MD for:  difficulty breathing, headache or visual disturbances   Complete by: As directed    Call MD for:  extreme fatigue   Complete by: As directed    Call MD for:  hives   Complete by: As directed    Call MD for:  persistant dizziness or light-headedness   Complete by: As directed    Call MD for:  persistant nausea and vomiting   Complete by: As directed    Call MD for:  redness, tenderness, or signs of infection (pain, swelling, redness, odor or green/yellow discharge around incision site)   Complete by: As directed    Call MD for:  severe uncontrolled pain   Complete by: As directed    Call MD for:  temperature >100.4   Complete by: As directed    Diet - low sodium  heart healthy   Complete by: As directed    Discharge patient   Complete by: As directed    Discharge disposition: 01-Home or Self Care   Discharge patient date: 12/05/2019   Increase activity slowly   Complete by: As directed      Allergies as of 12/05/2019      Reactions   Chocolate Rash      Medication List    STOP taking these medications   ibuprofen 600 MG tablet Commonly known as: ADVIL   methocarbamol 500 MG tablet Commonly known as: ROBAXIN   naproxen 375 MG tablet Commonly known as: NAPROSYN     TAKE these medications   acetaminophen 500 MG tablet Commonly known as: TYLENOL Take 1,000 mg by mouth every 6 (six) hours as needed for mild pain.   amoxicillin-clavulanate 875-125 MG tablet Commonly known as: AUGMENTIN Take 1 tablet by mouth every 12 (twelve) hours.   Flax Seed Oil 1000 MG Caps Take 1,000 mg by mouth daily.   lactobacillus acidophilus Tabs tablet Take 2 tablets by mouth 3 (three) times daily.   linezolid 600 MG tablet Commonly known as: ZYVOX Take 1 tablet (600 mg total) by mouth 2 (two) times daily.   metFORMIN 500 MG tablet Commonly known as: GLUCOPHAGE Take 500 mg by mouth daily.   naproxen sodium 220 MG tablet Commonly known as: ALEVE Take 220  mg by mouth as needed (pain).   pravastatin 40 MG tablet Commonly known as: PRAVACHOL Take 40 mg by mouth at bedtime.      Follow-up Information    Milly Jakob, MD Follow up.   Specialty: Orthopedic Surgery Why: Office will call you to make an appointment for 7-10 days from your procedure Contact information: [address] [place] [address] [phone]          Allergies  Allergen Reactions  . Chocolate Rash    Consultations:  Infectious Disease  Orthopedics    Procedures/Studies:  I/D on 1/15 by Dr. Grandville Silos    Subjective:  Patient reports he feels "100%" better. No pain with ROM of DIP. No fevers or chills. Denies skin rash.  Discharge Exam: Vitals:    12/05/19 0443 12/05/19 1321  BP: 125/79 110/74  Pulse: 60 66  Resp: 16 18  Temp: 97.8 F (36.6 C) 97.6 F (36.4 C)  SpO2: 100% 98%   Vitals:   12/04/19 1301 12/04/19 2001 12/05/19 0443 12/05/19 1321  BP:  (!) 136/95 125/79 110/74  Pulse:  70 60 66  Resp:  17 16 18   Temp:  98.5 F (36.9 C) 97.8 F (36.6 C) 97.6 F (36.4 C)  TempSrc:  Oral Oral Oral  SpO2:  100% 100% 98%  Weight: 74 kg     Height: 5\' 4"  (1.626 m)       General: Pt is alert, awake, not in acute distress Cardiovascular: RRR, S1/S2 +, no rubs, no gallops Respiratory: CTA bilaterally, no wheezing, no rhonchi Abdominal: Soft, NT, ND, bowel sounds + Extremities: no edema, no cyanosis Right 3rd digit in bandage, loose, warm, well perfused, good cap refill, C/D/I     The results of significant diagnostics from this hospitalization (including imaging, microbiology, ancillary and laboratory) are listed below for reference.     Microbiology: Recent Results (from the past 240 hour(s))  SARS CORONAVIRUS 2 (TAT 6-24 HRS) Nasopharyngeal Nasopharyngeal Swab     Status: None   Collection Time: 12/04/19  3:10 PM   Specimen: Nasopharyngeal Swab  Result Value Ref Range Status   SARS Coronavirus 2 NEGATIVE NEGATIVE Final    Comment: (NOTE) SARS-CoV-2 target nucleic acids are NOT DETECTED. The SARS-CoV-2 RNA is generally detectable in upper and lower respiratory specimens during the acute phase of infection. Negative results do not preclude SARS-CoV-2 infection, do not rule out co-infections with other pathogens, and should not be used as the sole basis for treatment or other patient management decisions. Negative results must be combined with clinical observations, patient history, and epidemiological information. The expected result is Negative. Fact Sheet for Patients: SugarRoll.be Fact Sheet for Healthcare Providers: https://www.woods-mathews.com/ This test is not yet  approved or cleared by the [place] FDA and  has been authorized for detection and/or diagnosis of SARS-CoV-2 by FDA under an Emergency Use Authorization (EUA). This EUA will remain  in effect (meaning this test can be used) for the duration of the COVID-19 declaration under Section 56 4(b)(1) of the Act, 21 U.S.C. section 360bbb-3(b)(1), unless the authorization is terminated or revoked sooner. Performed at [facility], [place] [address] [place], [place] [address]   Aerobic Culture (superficial specimen)     Status: None (Preliminary result)   Collection Time: 12/04/19  3:39 PM   Specimen: Wound  Result Value Ref Range Status   Specimen Description WOUND RIGHT FINGER  Final   Special Requests NONE  Final   Gram Stain NO WBC SEEN  NO ORGANISMS SEEN   Final   Culture   Final    FEW STAPHYLOCOCCUS AUREUS CULTURE REINCUBATED FOR BETTER GROWTH Performed at [facility], [address] [place], [place] [address]    Report Status PENDING  Incomplete     Labs: BNP (last 3 results) No results for input(s): BNP in the last 8760 hours. Basic Metabolic Panel: Recent Labs  Lab 12/04/19 1303  NA 141  K 3.5  CL 101  CO2 30  GLUCOSE 129*  BUN 15  CREATININE 1.07  CALCIUM 9.3   Liver Function Tests: Recent Labs  Lab 12/04/19 1303  AST 18  ALT 14  ALKPHOS 44  BILITOT 0.7  PROT 6.9  ALBUMIN 3.7   No results for input(s): LIPASE, AMYLASE in the last 168 hours. No results for input(s): AMMONIA in the last 168 hours. CBC: Recent Labs  Lab 12/04/19 1303 12/05/19 0441  WBC 6.2 6.1  HGB 12.9* 12.4*  HCT 38.3* 36.2*  MCV 80.6 79.4*  PLT 326 323   Time coordinating discharge: Over 30 minutes  SIGNED:  Bonner Puna. Manson Passey,  MD  [facility] 12/05/2019, 5:52 PM Pager [phone]   If 7PM-7AM, please contact night-coverage [email or web address] Password [email or web address]

## 2019-12-05 NOTE — Evaluation (Signed)
Occupational Therapy Evaluation and Discharge Patient Details Name: Paul Key MRN: [id number] DOB: 1976/07/03 Today's Date: 12/05/2019    History of Present Illness Radames Mejorado is a 44 y.o. male with medical history significant of IIDM, HLD, whocut his right long finger while opening a can about 2.5 weeks ago. Next day his finger started to swell along with significant pain.Orthopedic surgeon was consulted who did a bedside incision and drainage of the right mid finger PIP joint   Clinical Impression   This 44 yo admitted and underwent above presents to acute OT with all education completed and pt to D/C home today, we will D/C pt from OT.    Follow Up Recommendations  Follow surgeon's recommendation for DC plan and follow-up therapies    Equipment Recommendations  None recommended by OT       Precautions / Restrictions Precautions Precaution Comments: Keep wound clean and dry Restrictions Weight Bearing Restrictions: No      Mobility Bed Mobility Overal bed mobility: Independent                Transfers  Independent                    Balance Overall balance assessment: Independent                                         ADL either performed or assessed with clinical judgement   ADL Overall ADL's : Modified independent                                       General ADL Comments: PT aware to keep right middle finger dry     Vision Patient Visual Report: No change from baseline              Pertinent Vitals/Pain Pain Assessment: Faces Faces Pain Scale: Hurts little more Pain Location: right finger Pain Descriptors / Indicators: Grimacing(when bandage was being removed) Pain Intervention(s): Monitored during session     Hand Dominance (ambidextrous)   Extremity/Trunk Assessment Upper Extremity Assessment Upper Extremity Assessment: RUE deficits/detail RUE Deficits / Details: Pt with right middle finder I  &D due to infection. Pt with decreased AROM/PROM of PIP but reports it is so much more less painful than before I &D. He can fully extend 3rd digit but trace active flexion. Mild bloody drainage out of one site of stitch with increased with PROM. Mild PROM tolerated           Communication Communication Communication: No difficulties   Cognition Arousal/Alertness: Awake/alert Behavior During Therapy: WFL for tasks assessed/performed Overall Cognitive Status: Within Functional Limits for tasks assessed                                        Exercises Other Exercises Other Exercises: Pt educated to do 10 reps 5 times a day of AROM finger extension followed by AROM finger flexion as he can then to A middle digit on right hand for flexion with LUE; as well to just functionally use RUE. Pt returned demonstrated and was able to repeat back to me how often and how many reps a day.        Home  Living Family/patient expects to be discharged to:: Private residence Living Arrangements: Alone                                               OT Problem List: Decreased range of motion;Decreased strength;Impaired UE functional use;Pain         OT Goals(Current goals can be found in the care plan section) Acute Rehab OT Goals Patient Stated Goal: home today  OT Frequency:                AM-PAC OT "6 Clicks" Daily Activity     Outcome Measure Help from another person eating meals?: None Help from another person taking care of personal grooming?: None Help from another person toileting, which includes using toliet, bedpan, or urinal?: None Help from another person bathing (including washing, rinsing, drying)?: None Help from another person to put on and taking off regular upper body clothing?: None Help from another person to put on and taking off regular lower body clothing?: None 6 Click Score: 24   End of Session Nurse Communication: (pt ready to go  from OT standpoint and I changed the dressing on his finger as indicated in chart (light dry gauze dressing and use paper tape))  Activity Tolerance: Patient tolerated treatment well Patient left: (sitting EOB eating lunch)  OT Visit Diagnosis: Pain Pain - Right/Left: Right Pain - part of body: (finger)                Time: 7846-9629 OT Time Calculation (min): 20 min Charges:  OT General Charges $OT Visit: 1 Visit OT Evaluation $OT Eval Moderate Complexity: 1 Mod  Cathy OTR/L Acute [employer] Pager [phone] Office [phone]     12/05/2019, 4:36 PM

## 2019-12-07 LAB — AEROBIC CULTURE W GRAM STAIN (SUPERFICIAL SPECIMEN): Gram Stain: NONE SEEN

## 2019-12-10 ENCOUNTER — Ambulatory Visit: Payer: Medicare Other | Admitting: Internal Medicine

## 2020-01-05 ENCOUNTER — Ambulatory Visit (INDEPENDENT_AMBULATORY_CARE_PROVIDER_SITE_OTHER): Payer: Medicare Other | Admitting: Internal Medicine

## 2020-01-05 ENCOUNTER — Ambulatory Visit
Admission: RE | Admit: 2020-01-05 | Discharge: 2020-01-05 | Disposition: A | Discharge status: Home / Self Care | Payer: Medicare Other | Source: Ambulatory Visit | Attending: Internal Medicine | Admitting: Internal Medicine

## 2020-01-05 ENCOUNTER — Encounter: Payer: Self-pay | Admitting: Internal Medicine

## 2020-01-05 ENCOUNTER — Other Ambulatory Visit: Payer: Self-pay

## 2020-01-05 DIAGNOSIS — M0000 Staphylococcal arthritis, unspecified joint: Secondary | ICD-10-CM

## 2020-01-05 NOTE — Progress Notes (Addendum)
[facility] for Infectious Disease  Patient Active Problem List   Diagnosis Date Noted  . Septic arthritis ([place]) 12/04/2019  . Diabetes mellitus ([place]) 12/04/2019  . Dyslipidemia 12/04/2019  . Appendicitis with abscess 07/01/2012    Patient's Medications  New Prescriptions   No medications on file  Previous Medications   ACETAMINOPHEN (TYLENOL) 500 MG TABLET    Take 1,000 mg by mouth every 6 (six) hours as needed for mild pain.   AMOXICILLIN-CLAVULANATE (AUGMENTIN) 875-125 MG TABLET    Take 1 tablet by mouth every 12 (twelve) hours.   FLAXSEED, LINSEED, (FLAX SEED OIL) 1000 MG CAPS    Take 1,000 mg by mouth daily.   LACTOBACILLUS ACIDOPHILUS (BACID) TABS TABLET    Take 2 tablets by mouth 3 (three) times daily.   LINEZOLID (ZYVOX) 600 MG TABLET    Take 1 tablet (600 mg total) by mouth 2 (two) times daily.   METFORMIN (GLUCOPHAGE) 500 MG TABLET    Take 500 mg by mouth daily.   NAPROXEN SODIUM (ALEVE) 220 MG TABLET    Take 220 mg by mouth as needed (pain).   PRAVASTATIN (PRAVACHOL) 40 MG TABLET    Take 40 mg by mouth at bedtime.  Modified Medications   No medications on file  Discontinued Medications   No medications on file    Subjective: Paul Key is in for a follow-up visit from a recent ED stay.  Paul Key is a 44 y.o. male who cut the dorsal surface of his right long finger while using a can opener to open a can about 6 weeks ago.  The cut bled freely.  Paul Key cleaned and bandaged the cut but over the next week Paul Key had increasing pain and swelling.  Paul Key says that Paul Key finally decided to come to the emergency department on 12/04/2019.  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  Paul Key is ambidextrous.  Paul Key was seen by Dr. Milly Jakob in the ED who did a bedside I&D.  Paul Key expressed concern that the PIP joint may be infected.  Operative specimens grew MSSA and methicillin sensitive staph lugdunensis. Paul Key lives alone here in [place].  Paul Key has underlying diabetes and hyperlipidemia  for which Paul Key takes Metformin and pravastatin.  I recommended discharged on oral amoxicillin clavulanate and linezolid for 3 weeks.  Paul Key was due to follow-up with me on 12/10/2019 but missed that visit.  Paul Key says that Paul Key is doing better but still has swelling and stiffness in his right long finger.  Paul Key denies any problems tolerating his antibiotics.  Paul Key should have run out on his antibiotics 1 week ago but still has about 4 to 5 days of both antibiotics left.  Paul Key says that Paul Key may have been missing doses.  Paul Key says that Paul Key often takes long showers and may have missed a dose.  Paul Key says that Paul Key often falls asleep right after eating and may have been missing doses at night.  Review of Systems: Review of Systems  Constitutional: Negative for chills, diaphoresis and fever.  Gastrointestinal: Negative for abdominal pain, diarrhea, nausea and vomiting.  Musculoskeletal: Positive for joint pain.  Skin: Negative for rash.    Past Medical History:  Diagnosis Date  . Appendicitis with abscess   . Diabetes mellitus   . Hyperlipidemia     Social History   Tobacco Use  . Smoking status: Never Smoker  . Smokeless tobacco: Never Used  Substance Use Topics  . Alcohol  use: No  . Drug use: No    No family history on file.  Allergies  Allergen Reactions  . Chocolate Rash    Objective: Vitals:   01/05/20 1131  BP: 117/77  Pulse: 65  Temp: 98.1 F (36.7 C)  TempSrc: Oral  SpO2: 98%  Weight: 165 lb (74.8 kg)  Height: 5\' 6"  (1.676 m)   Body mass index is 26.63 kg/m.  Physical Exam Constitutional:      Comments: Paul Key is in no distress.  Musculoskeletal:     Comments: Paul Key has diffuse swelling of his right long finger.  His surgical incision is healed without any drainage.  There is no unusual warmth or redness.  Has very limited range of motion.  Has only minimal pain with range of motion.     Lab Results Sed Rate  Date Value  01/05/2020 6 mm/h  12/04/2019 14 mm/hr   CRP  Date Value    01/05/2020 3.3 mg/L  12/04/2019 <0.5 mg/dL   Right hand x-ray 12/06/2019  IMPRESSION: Subtle 2 mm lucency at the subchondral aspect of the long finger middle phalanx adjacent to the DIP joint which appears slightly more conspicuous compared to prior study. Additional subtle subchondral lucency at the base of the long finger middle phalanx abutting the PIP joint which was not evident on the prior study. Given the history of laceration with concern for infection, erosive changes related to septic arthritis are not excluded. If further imaging evaluation is clinically warranted, consider MRI.    By: 0/08/2724 D.O.   On: 01/05/2020 17:09    Problem List Items Addressed This Visit      Unprioritized   Septic arthritis (HCC)    I am not sure if his persistent swelling and stiffness is due to active infection or residual scarring.  It is obvious that Paul Key has not been taking his antibiotics correctly or consistently.  I instructed him to finish up both amoxicillin clavulanate and linezolid as instructed.  Paul Key will get blood work and an x-ray today and follow-up in 1 week.  Also instructed him how to do physical therapy to increase range of motion.      Relevant Orders   CBC   Basic metabolic panel   C-reactive protein   Sedimentation rate   DG Hand 2 View Right       01/07/2020, MD [facility] for Infectious Disease [facility] [phone] pager   [phone] cell 01/05/2020, 11:50 AM

## 2020-01-05 NOTE — Assessment & Plan Note (Addendum)
I am not sure if his persistent swelling and stiffness is due to active infection or residual scarring.  It is obvious that he has not been taking his antibiotics correctly or consistently.  I instructed him to finish up both amoxicillin clavulanate and linezolid as instructed.  He will get blood work and an x-ray today and follow-up in 1 week.  Also instructed him how to do physical therapy to increase range of motion.  Addendum: Paul Key inflammatory markers are normal but his exam and x-ray results suggest the possibility of persistent septic arthritis and adjacent osteomyelitis.  I will change him to oral cephalexin and extend therapy for 3-4 more weeks.

## 2020-01-06 LAB — BASIC METABOLIC PANEL
BUN: 11 mg/dL (ref 7–25)
CO2: 28 mmol/L (ref 20–32)
Calcium: 9.8 mg/dL (ref 8.6–10.3)
Chloride: 104 mmol/L (ref 98–110)
Creat: 1.03 mg/dL (ref 0.60–1.35)
Glucose, Bld: 113 mg/dL — ABNORMAL HIGH (ref 65–99)
Potassium: 4 mmol/L (ref 3.5–5.3)
Sodium: 142 mmol/L (ref 135–146)

## 2020-01-06 LAB — CBC
HCT: 42 % (ref 38.5–50.0)
Hemoglobin: 13.9 g/dL (ref 13.2–17.1)
MCH: 27.6 pg (ref 27.0–33.0)
MCHC: 33.1 g/dL (ref 32.0–36.0)
MCV: 83.3 fL (ref 80.0–100.0)
MPV: 10.3 fL (ref 7.5–12.5)
Platelets: 289 10*3/uL (ref 140–400)
RBC: 5.04 10*6/uL (ref 4.20–5.80)
RDW: 16.4 % — ABNORMAL HIGH (ref 11.0–15.0)
WBC: 5.7 10*3/uL (ref 3.8–10.8)

## 2020-01-06 LAB — C-REACTIVE PROTEIN: CRP: 3.3 mg/L (ref <8.0)

## 2020-01-06 LAB — SEDIMENTATION RATE: Sed Rate: 6 mm/h (ref 0–15)

## 2020-01-06 MED ORDER — CEPHALEXIN 500 MG PO CAPS: 500.0000 mg | ORAL_CAPSULE | Freq: Three times a day (TID) | ORAL | 0 refills | Status: AC

## 2020-01-06 NOTE — Addendum Note (Signed)
Addended by: Cliffton Asters on: 01/06/2020 10:03 AM   Modules accepted: Orders

## 2020-01-12 ENCOUNTER — Other Ambulatory Visit: Payer: Self-pay

## 2020-01-12 ENCOUNTER — Ambulatory Visit: Payer: Medicare Other | Admitting: Internal Medicine

## 2020-01-12 ENCOUNTER — Encounter: Payer: Self-pay | Admitting: Internal Medicine

## 2020-01-12 ENCOUNTER — Ambulatory Visit (INDEPENDENT_AMBULATORY_CARE_PROVIDER_SITE_OTHER): Payer: Medicare Other | Admitting: Internal Medicine

## 2020-01-12 DIAGNOSIS — M0000 Staphylococcal arthritis, unspecified joint: Secondary | ICD-10-CM | POA: Diagnosis not present

## 2020-01-12 NOTE — Assessment & Plan Note (Signed)
We will continue with cephalexin for 3 more weeks.  Strongly encouraged him to continue to work harder on increasing his range of motion.  I showed him several exercises that he can do throughout the day.  He will follow-up here in 3 weeks

## 2020-01-12 NOTE — Progress Notes (Signed)
[place] for Infectious Disease  Patient Active Problem List   Diagnosis Date Noted  . Septic arthritis ([place]) 12/04/2019  . Diabetes mellitus ([place]) 12/04/2019  . Dyslipidemia 12/04/2019  . Appendicitis with abscess 07/01/2012    Patient's Medications  New Prescriptions   No medications on file  Previous Medications   ACETAMINOPHEN (TYLENOL) 500 MG TABLET    Take 1,000 mg by mouth every 6 (six) hours as needed for mild pain.   CEPHALEXIN (KEFLEX) 500 MG CAPSULE    Take 1 capsule (500 mg total) by mouth 3 (three) times daily.   FLAXSEED, LINSEED, (FLAX SEED OIL) 1000 MG CAPS    Take 1,000 mg by mouth daily.   LACTOBACILLUS ACIDOPHILUS (BACID) TABS TABLET    Take 2 tablets by mouth 3 (three) times daily.   METFORMIN (GLUCOPHAGE) 500 MG TABLET    Take 500 mg by mouth daily.   NAPROXEN SODIUM (ALEVE) 220 MG TABLET    Take 220 mg by mouth as needed (pain).   PRAVASTATIN (PRAVACHOL) 40 MG TABLET    Take 40 mg by mouth at bedtime.  Modified Medications   No medications on file  Discontinued Medications   No medications on file    Subjective: Mr. Woodfield is in for a follow-up visit from a recent ED stay.  He is a 44 y.o. male who cut the dorsal surface of his right long finger while using a can opener to open a can about 6 weeks ago.  The cut bled freely.  He cleaned and bandaged the cut but over the next week he had increasing pain and swelling.  He says that he finally decided to come to the emergency department on 12/04/2019.  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  He was seen by Dr. Milly Jakob in the ED who did a bedside I&D.  He expressed concern that the PIP joint may be infected.  Operative specimens grew MSSA and methicillin sensitive staph lugdunensis. Mr. Snelgrove lives alone here in [place].  He has underlying diabetes and hyperlipidemia for which he takes Metformin and pravastatin.  I recommended discharged on oral amoxicillin  clavulanate and linezolid for 3 weeks.  He was due to follow-up with me on 12/10/2019 but missed that visit.  When he followed up with me last week he still had about 5 days left of his antibiotics indicating that he had not been taking them consistently.  I obtained a repeat x-ray of his hand which showed:  IMPRESSION: Subtle 2 mm lucency at the subchondral aspect of the long finger middle phalanx adjacent to the DIP joint which appears slightly more conspicuous compared to prior study. Additional subtle subchondral lucency at the base of the long finger middle phalanx abutting the PIP joint which was not evident on the prior study. Given the history of laceration with concern for infection, erosive changes related to septic arthritis are not excluded. If further imaging evaluation is clinically warranted, consider MRI.  Although explanatory markers were normal I decided to extend his therapy with oral cephalexin.  He has not had any problems tolerating the cephalexin.  Review of Systems: Review of Systems  Constitutional: Negative for chills and fever.  Gastrointestinal: Negative for abdominal pain, diarrhea, nausea and vomiting.  Musculoskeletal: Negative for joint pain.       He has been doing some work on range of motion exercises but still has significant stiffness in his right long  finger.    Past Medical History:  Diagnosis Date  . Appendicitis with abscess   . Diabetes mellitus   . Hyperlipidemia     Social History   Tobacco Use  . Smoking status: Never Smoker  . Smokeless tobacco: Never Used  Substance Use Topics  . Alcohol use: No  . Drug use: No    No family history on file.  Allergies  Allergen Reactions  . Chocolate Rash    Objective: Vitals:   01/12/20 1614  BP: 110/70  Pulse: 88  Temp: 98.3 F (36.8 C)  TempSrc: Oral  Weight: 179 lb (81.2 kg)   Body mass index is 28.89 kg/m.  Physical Exam Constitutional:      Comments: He is in no distress.    Musculoskeletal:     Comments: Swelling of his right long finger has improved.  He still has limited flexion.     Lab Results Sed Rate  Date Value  01/05/2020 6 mm/h  12/04/2019 14 mm/hr   CRP  Date Value  01/05/2020 3.3 mg/L  12/04/2019 <0.5 mg/dL   Right hand x-ray 6/96/7893  IMPRESSION: Subtle 2 mm lucency at the subchondral aspect of the long finger middle phalanx adjacent to the DIP joint which appears slightly more conspicuous compared to prior study. Additional subtle subchondral lucency at the base of the long finger middle phalanx abutting the PIP joint which was not evident on the prior study. Given the history of laceration with concern for infection, erosive changes related to septic arthritis are not excluded. If further imaging evaluation is clinically warranted, consider MRI.    By: Duanne Guess D.O.   On: 01/05/2020 17:09    Problem List Items Addressed This Visit      Unprioritized   Septic arthritis (HCC)    We will continue with cephalexin for 3 more weeks.  Strongly encouraged him to continue to work harder on increasing his range of motion.  I showed him several exercises that he can do throughout the day.  He will follow-up here in 3 weeks          Cliffton Asters, MD [facility] for Infectious Disease [facility] [phone] pager   [phone] cell 01/12/2020, 4:35 PM

## 2020-02-02 ENCOUNTER — Ambulatory Visit: Payer: Medicare Other | Admitting: Internal Medicine

## 2020-03-10 ENCOUNTER — Ambulatory Visit: Payer: Medicare Other | Admitting: Internal Medicine

## 2020-04-14 ENCOUNTER — Ambulatory Visit: Payer: Medicare Other | Attending: Internal Medicine

## 2020-04-14 DIAGNOSIS — Z23 Encounter for immunization: Secondary | ICD-10-CM

## 2020-04-14 NOTE — Progress Notes (Signed)
   Covid-19 Vaccination Clinic  Name:  Paul Key    MRN: [id number] DOB: 1976-05-18  04/14/2020  [name] was observed post Covid-19 immunization for 15 minutes without incident. He was provided with Vaccine Information Sheet and instruction to access the V-Safe system.   [name] was instructed to call 911 with any severe reactions post vaccine: Marland Kitchen Difficulty breathing  . Swelling of face and throat  . A fast heartbeat  . A bad rash all over body  . Dizziness and weakness   Immunizations Administered    Name Date Dose VIS Date Route   Moderna COVID-19 Vaccine 04/14/2020  5:53 PM 0.5 mL 10/2019 Intramuscular   Manufacturer: Moderna   Lot: [id number]   NDC: 60479-987-21

## 2020-05-12 ENCOUNTER — Ambulatory Visit: Payer: Medicare Other | Attending: Internal Medicine

## 2020-05-12 DIAGNOSIS — Z23 Encounter for immunization: Secondary | ICD-10-CM

## 2020-05-12 NOTE — Progress Notes (Signed)
° °  Covid-19 Vaccination Clinic  Name:  Paul Key    MRN: [id number] DOB: [birth year]  05/12/2020  Mr. Paul Key was observed post Covid-19 immunization for 15 minutes without incident. He was provided with Vaccine Information Sheet and instruction to access the V-Safe system.   Mr. Paul Key was instructed to call 911 with any severe reactions post vaccine:  Difficulty breathing   Swelling of face and throat   A fast heartbeat   A bad rash all over body   Dizziness and weakness   Immunizations Administered    Name Date Dose VIS Date Route   Moderna COVID-19 Vaccine 05/12/2020  4:40 PM 0.5 mL 10/2019 Intramuscular   Manufacturer: Moderna   Lot: [id number]   NDC: 95072-257-50

## 2020-06-20 ENCOUNTER — Other Ambulatory Visit: Payer: Self-pay | Admitting: Internal Medicine

## 2020-06-20 DIAGNOSIS — M0000 Staphylococcal arthritis, unspecified joint: Secondary | ICD-10-CM

## 2020-06-29 ENCOUNTER — Ambulatory Visit: Payer: Medicare Other | Admitting: Internal Medicine

## 2021-06-02 ENCOUNTER — Emergency Department (HOSPITAL_COMMUNITY)
Admission: EM | Admit: 2021-06-02 | Discharge: 2021-06-02 | Disposition: A | Discharge status: Home / Self Care | Payer: Medicare Other | Attending: Emergency Medicine | Admitting: Emergency Medicine

## 2021-06-02 ENCOUNTER — Encounter (HOSPITAL_COMMUNITY): Payer: Self-pay | Admitting: Emergency Medicine

## 2021-06-02 DIAGNOSIS — E1169 Type 2 diabetes mellitus with other specified complication: Secondary | ICD-10-CM | POA: Insufficient documentation

## 2021-06-02 DIAGNOSIS — Z7984 Long term (current) use of oral hypoglycemic drugs: Secondary | ICD-10-CM | POA: Diagnosis not present

## 2021-06-02 DIAGNOSIS — L84 Corns and callosities: Secondary | ICD-10-CM

## 2021-06-02 DIAGNOSIS — M79671 Pain in right foot: Secondary | ICD-10-CM | POA: Diagnosis present

## 2021-06-02 DIAGNOSIS — E785 Hyperlipidemia, unspecified: Secondary | ICD-10-CM | POA: Insufficient documentation

## 2021-06-02 NOTE — ED Provider Notes (Signed)
[facility] EMERGENCY DEPARTMENT Provider Note   CSN: [id number] Arrival date & time: 06/02/21  2049     History No chief complaint on file.   Paul Key is a 45 y.o. male with history of diabetes mellitus, hyperlipidemia.  Patient presents to the emergency department with a chief complaint of something on the bottom of his right foot patient reports that he first noticed this 2 to 3 weeks ago.  Patient states that has gotten larger in size.  Patient complains of pain to affected area when standing or walking.  Patient denies any recent falls or injuries.  No drainage, rash, erythema, fevers, chills, nausea or vomiting.    HPI     Past Medical History:  Diagnosis Date   Appendicitis with abscess    Diabetes mellitus    Hyperlipidemia     Patient Active Problem List   Diagnosis Date Noted   Septic arthritis (HCC) 12/04/2019   Diabetes mellitus (HCC) 12/04/2019   Dyslipidemia 12/04/2019   Appendicitis with abscess 07/01/2012    Past Surgical History:  Procedure Laterality Date   APPENDECTOMY     LAPAROSCOPIC APPENDECTOMY  08/21/2012   Procedure: APPENDECTOMY LAPAROSCOPIC;  Surgeon: Adolph Pollack, MD;  Location: [facility];  Service: General;  Laterality: N/A;       No family history on file.  Social History   Tobacco Use   Smoking status: Never   Smokeless tobacco: Never  Substance Use Topics   Alcohol use: No   Drug use: No    Home Medications Prior to Admission medications   Medication Sig Start Date End Date Taking? Authorizing Provider  acetaminophen (TYLENOL) 500 MG tablet Take 1,000 mg by mouth every 6 (six) hours as needed for mild pain.    [provider]  cephALEXin (KEFLEX) 500 MG capsule Take 1 capsule (500 mg total) by mouth 3 (three) times daily. 01/06/20   Cliffton Asters, MD  Flaxseed, Linseed, (FLAX SEED OIL) 1000 MG CAPS Take 1,000 mg by mouth daily.    [provider]  lactobacillus acidophilus (BACID)  TABS tablet Take 2 tablets by mouth 3 (three) times daily. 12/05/19   Westley Chandler, MD  metFORMIN (GLUCOPHAGE) 500 MG tablet Take 500 mg by mouth daily. 11/16/19   [provider]  naproxen sodium (ALEVE) 220 MG tablet Take 220 mg by mouth as needed (pain).    [provider]  pravastatin (PRAVACHOL) 40 MG tablet Take 40 mg by mouth at bedtime. 11/16/19   [provider]    Allergies    Chocolate  Review of Systems   Review of Systems  Constitutional:  Negative for chills and fever.  Gastrointestinal:  Negative for nausea and vomiting.  Musculoskeletal:  Negative for arthralgias and myalgias.  Skin:  Negative for color change, pallor, rash and wound.  Psychiatric/Behavioral:  Negative for confusion.    Physical Exam Updated Vital Signs BP 124/68 (BP Location: Left Arm)   Pulse 92   Temp 98.2 F (36.8 C) (Oral)   Resp 18   SpO2 100%   Physical Exam Vitals and nursing note reviewed.  Constitutional:      General: He is not in acute distress.    Appearance: He is not ill-appearing, toxic-appearing or diaphoretic.  HENT:     Head: Normocephalic.  Eyes:     General: No scleral icterus.       Right eye: No discharge.        Left eye: No discharge.  Cardiovascular:     Rate and Rhythm: Normal rate.     Pulses:          Dorsalis pedis pulses are 2+ on the right side.  Pulmonary:     Effort: Pulmonary effort is normal.  Feet:     Right foot:     Skin integrity: Callus and dry skin present. No ulcer, blister, skin breakdown, erythema, warmth or fissure.     Toenail Condition: Right toenails are abnormally thick.     Comments: Circular area of thickened skin skin to sole of right foot inferior to right fifth digit diameter of 1.5 cm.  no erythema, rash, fluctuance, induration.  No tenderness to palpation.  No wounds noted to affected foot Skin:    General: Skin is warm and dry.  Neurological:     General: No focal deficit present.     Mental  Status: He is alert.  Psychiatric:        Behavior: Behavior is cooperative.    ED Results / Procedures / Treatments   Labs (all labs ordered are listed, but only abnormal results are displayed) Labs Reviewed - No data to display  EKG None  Radiology No results found.  Procedures Procedures   Medications Ordered in ED Medications - No data to display  ED Course  I have reviewed the triage vital signs and the nursing notes.  Pertinent labs & imaging results that were available during my care of the patient were reviewed by me and considered in my medical decision making (see chart for details).    MDM Rules/Calculators/A&P                          Alert 45 year old male no acute distress, nontoxic-appearing.  Patient presents the emergency department with a chief complaint of tingling on the bottom of his right foot.  Circular area of thickened skin skin to sole of right foot inferior to right fifth digit diameter of 1.5 cm.  no erythema, rash, fluctuance, induration.  No tenderness to palpation.  No wounds noted to affected foot  Low suspicion for cellulitis or diabetic foot wound.  We will give patient information to follow-up with podiatry.  Patient given strict return precautions.  Patient expressed understanding of all instructions and is agreeable with this plan.  Final Clinical Impression(s) / ED Diagnoses Final diagnoses:  Corn of foot    Rx / DC Orders ED Discharge Orders     None        Berneice Heinrich 06/02/21 2341    Charlynne Pander, MD 06/03/21 1843

## 2021-06-02 NOTE — Discharge Instructions (Addendum)
You came to the emergency department to have your foot examined.  Your physical exam was reassuring.  An area of skin on your foot also known as a corn.  You will need to follow-up with a podiatrist for further management of this issue.    Get help right away if: You develop severe pain with redness.

## 2021-06-02 NOTE — ED Triage Notes (Signed)
Patient reports worsening thick corn /callus at right foot for several weeks .

## 2021-06-13 ENCOUNTER — Ambulatory Visit (INDEPENDENT_AMBULATORY_CARE_PROVIDER_SITE_OTHER): Payer: Medicare Other | Admitting: Podiatry

## 2021-06-13 ENCOUNTER — Other Ambulatory Visit: Payer: Self-pay

## 2021-06-13 ENCOUNTER — Encounter: Payer: Self-pay | Admitting: Podiatry

## 2021-06-13 DIAGNOSIS — E119 Type 2 diabetes mellitus without complications: Secondary | ICD-10-CM | POA: Diagnosis not present

## 2021-06-13 DIAGNOSIS — M79675 Pain in left toe(s): Secondary | ICD-10-CM

## 2021-06-13 DIAGNOSIS — M79674 Pain in right toe(s): Secondary | ICD-10-CM | POA: Diagnosis not present

## 2021-06-13 DIAGNOSIS — Q828 Other specified congenital malformations of skin: Secondary | ICD-10-CM

## 2021-06-13 DIAGNOSIS — R52 Pain, unspecified: Secondary | ICD-10-CM | POA: Diagnosis not present

## 2021-06-13 DIAGNOSIS — B351 Tinea unguium: Secondary | ICD-10-CM

## 2021-06-13 NOTE — Patient Instructions (Signed)
Look for urea 40% cream or ointment and apply to the thickened dry skin / calluses. This can be bought over the counter, at a pharmacy or online such as [employer].  

## 2021-06-14 NOTE — Progress Notes (Signed)
  Subjective:  Patient ID: Paul Key, male    DOB: [birth year],  MRN: [id number]  Chief Complaint  Patient presents with   Callouses      (np) right foot has a round hard place on the bottom of right foot   Nail Problem    45 y.o. male presents with the above complaint. History confirmed with patient.  Nails are thickened and elongated he has difficulty cutting them  Objective:  Physical Exam: warm, good capillary refill, no trophic changes or ulcerative lesions, normal DP and PT pulses, and normal sensory exam. Left Foot: dystrophic yellowed discolored nail plates with subungual debris Right Foot: dystrophic yellowed discolored nail plates with subungual debris and porokeratosis submetatarsal 5   Assessment:   1. Pain due to onychomycosis of toenails of both feet   2. Porokeratosis   3. Pain   4. Type 2 diabetes mellitus without complication, without long-term current use of insulin (HCC)      Plan:  Patient was evaluated and treated and all questions answered.  Patient educated on diabetes. Discussed proper diabetic foot care and discussed risks and complications of disease. Educated patient in depth on reasons to return to the office immediately should he/she discover anything concerning or new on the feet. All questions answered. Discussed proper shoes as well.   All symptomatic hyperkeratoses were safely debrided with a sterile #15 blade to patient's level of comfort without incident. We discussed preventative and palliative care of these lesions including supportive and accommodative shoegear, padding, prefabricated and custom molded accommodative orthoses, use of a pumice stone and lotions/creams daily.  Discussed the etiology and treatment options for the condition in detail with the patient. Educated patient on the topical and oral treatment options for mycotic nails. Recommended debridement of the nails today. Sharp and mechanical debridement performed of all painful and  mycotic nails today. Nails debrided in length and thickness using a nail nipper to level of comfort. Discussed treatment options including appropriate shoe gear. Follow up as needed for painful nails.    Return in about 3 months (around 09/13/2021) for at risk diabetic foot care.

## 2021-09-13 ENCOUNTER — Ambulatory Visit: Payer: Medicare Other | Admitting: Podiatry

## 2021-09-20 ENCOUNTER — Ambulatory Visit (INDEPENDENT_AMBULATORY_CARE_PROVIDER_SITE_OTHER): Payer: Medicare Other | Admitting: Podiatry

## 2021-09-20 ENCOUNTER — Other Ambulatory Visit: Payer: Self-pay

## 2021-09-20 ENCOUNTER — Encounter: Payer: Self-pay | Admitting: Podiatry

## 2021-09-20 DIAGNOSIS — M216X1 Other acquired deformities of right foot: Secondary | ICD-10-CM | POA: Insufficient documentation

## 2021-09-20 DIAGNOSIS — Q828 Other specified congenital malformations of skin: Secondary | ICD-10-CM | POA: Diagnosis not present

## 2021-09-20 DIAGNOSIS — B351 Tinea unguium: Secondary | ICD-10-CM | POA: Diagnosis not present

## 2021-09-20 DIAGNOSIS — M79674 Pain in right toe(s): Secondary | ICD-10-CM | POA: Diagnosis not present

## 2021-09-20 DIAGNOSIS — E119 Type 2 diabetes mellitus without complications: Secondary | ICD-10-CM

## 2021-09-20 DIAGNOSIS — M79675 Pain in left toe(s): Secondary | ICD-10-CM | POA: Diagnosis not present

## 2021-09-20 NOTE — Addendum Note (Signed)
Addended by: Helane Gunther on: 09/20/2021 06:55 PM   Modules accepted: Level of Service

## 2021-09-20 NOTE — Progress Notes (Signed)
This patient returns to my office for at risk foot care.  This patient requires this care by a professional since this patient will be at risk due to having diabetes.  This patient is unable to cut nails himself since the patient cannot reach his nails.These nails are painful walking and wearing shoes.  He also has painful callus on the bottom of his right foot. This patient presents for at risk foot care today.  General Appearance  Alert, conversant and in no acute stress.  Vascular  Dorsalis pedis and posterior tibial  pulses are palpable  bilaterally.  Capillary return is within normal limits  bilaterally. Temperature is within normal limits  bilaterally.  Neurologic  Senn-Weinstein monofilament wire test within normal limits  bilaterally. Muscle power within normal limits bilaterally.  Nails Thick disfigured discolored nails with subungual debris  from hallux to fifth toes bilaterally. No evidence of bacterial infection or drainage bilaterally.  Orthopedic  No limitations of motion  feet .  No crepitus or effusions noted.  No bony pathology or digital deformities noted.  Skin  normotropic skin with no porokeratosis noted bilaterally.  No signs of infections or ulcers noted.   Callus/porokeratosis sub 5th metatarsal right foot.  Onychomycosis  Pain in right toes  Pain in left toes  Callus/porokeratosis right foot.  Consent was obtained for treatment procedures.   Mechanical debridement of nails 1-5  bilaterally performed with a nail nipper.  Filed with dremel without incident. Debrided porokeratosis with # 15 blade,  Discussed his callus with this patient and recommended surgical consult with Dr.  Charlsie Merles.   Return office visit  3 months                    Told patient to return for periodic foot care and evaluation due to potential at risk complications.   Helane Gunther DPM

## 2021-09-28 ENCOUNTER — Ambulatory Visit: Payer: Medicare Other | Admitting: Podiatry

## 2022-03-14 ENCOUNTER — Encounter: Payer: Self-pay | Admitting: Podiatry

## 2022-03-14 ENCOUNTER — Ambulatory Visit (INDEPENDENT_AMBULATORY_CARE_PROVIDER_SITE_OTHER): Payer: Medicare Other | Admitting: Podiatry

## 2022-03-14 DIAGNOSIS — B351 Tinea unguium: Secondary | ICD-10-CM

## 2022-03-14 DIAGNOSIS — M79674 Pain in right toe(s): Secondary | ICD-10-CM

## 2022-03-14 DIAGNOSIS — E119 Type 2 diabetes mellitus without complications: Secondary | ICD-10-CM

## 2022-03-14 DIAGNOSIS — M216X1 Other acquired deformities of right foot: Secondary | ICD-10-CM

## 2022-03-14 DIAGNOSIS — Q828 Other specified congenital malformations of skin: Secondary | ICD-10-CM

## 2022-03-14 DIAGNOSIS — M79675 Pain in left toe(s): Secondary | ICD-10-CM

## 2022-03-14 NOTE — Progress Notes (Signed)
This patient returns to my office for at risk foot care.  This patient requires this care by a professional since this patient will be at risk due to having diabetes.  This patient is unable to cut nails himself since the patient cannot reach his nails.These nails are painful walking and wearing shoes.  He also has painful callus on the bottom of his right foot. This patient presents for at risk foot care today.  General Appearance  Alert, conversant and in no acute stress.  Vascular  Dorsalis pedis and posterior tibial  pulses are palpable  bilaterally.  Capillary return is within normal limits  bilaterally. Temperature is within normal limits  bilaterally.  Neurologic  Senn-Weinstein monofilament wire test within normal limits  bilaterally. Muscle power within normal limits bilaterally.  Nails Thick disfigured discolored nails with subungual debris  from hallux to fifth toes bilaterally. No evidence of bacterial infection or drainage bilaterally.  Orthopedic  No limitations of motion  feet .  No crepitus or effusions noted.  No bony pathology or digital deformities noted.  Skin  normotropic skin with no porokeratosis noted bilaterally.  No signs of infections or ulcers noted.   Callus/porokeratosis sub 5th metatarsal right foot.  Onychomycosis  Pain in right toes  Pain in left toes  Callus/porokeratosis right foot.  Consent was obtained for treatment procedures.   Mechanical debridement of nails 1-5  bilaterally performed with a nail nipper.  Filed with dremel without incident. Debrided porokeratosis with # 15 blade,    Return office visit  3 months                    Told patient to return for periodic foot care and evaluation due to potential at risk complications.   Lan Entsminger DPM   

## 2022-04-23 ENCOUNTER — Encounter (HOSPITAL_COMMUNITY): Payer: Self-pay | Admitting: Emergency Medicine

## 2022-04-23 ENCOUNTER — Emergency Department (HOSPITAL_COMMUNITY)
Admission: EM | Admit: 2022-04-23 | Discharge: 2022-04-23 | Disposition: A | Discharge status: Home / Self Care | Payer: Medicare Other | Attending: Emergency Medicine | Admitting: Emergency Medicine

## 2022-04-23 DIAGNOSIS — S86912A Strain of unspecified muscle(s) and tendon(s) at lower leg level, left leg, initial encounter: Secondary | ICD-10-CM | POA: Insufficient documentation

## 2022-04-23 DIAGNOSIS — S8992XA Unspecified injury of left lower leg, initial encounter: Secondary | ICD-10-CM | POA: Diagnosis present

## 2022-04-23 DIAGNOSIS — X58XXXA Exposure to other specified factors, initial encounter: Secondary | ICD-10-CM | POA: Diagnosis not present

## 2022-04-23 NOTE — ED Provider Notes (Signed)
[facility] EMERGENCY DEPARTMENT Provider Note   CSN: [id number] Arrival date & time: 04/23/22  1445     History  Chief Complaint  Patient presents with   Leg Pain    Paul Key is a 46 y.o. male.  Patient reports he began having soreness in his left thigh and left side of his leg approximately 6 weeks ago patient reports the pain is still present but it has improved significantly.  Patient denies any fever or chills denies any injury to the area.  Denies any redness or swelling  The history is provided by the patient and the spouse. No language interpreter was used.  Leg Pain     Home Medications Prior to Admission medications   Medication Sig Start Date End Date Taking? Authorizing Provider  acetaminophen (TYLENOL) 500 MG tablet Take 1,000 mg by mouth every 6 (six) hours as needed for mild pain.    [provider]  cephALEXin (KEFLEX) 500 MG capsule Take 1 capsule (500 mg total) by mouth 3 (three) times daily. 01/06/20   Cliffton Asters, MD  Flaxseed, Linseed, (FLAX SEED OIL) 1000 MG CAPS Take 1,000 mg by mouth daily.    [provider]  lactobacillus acidophilus (BACID) TABS tablet Take 2 tablets by mouth 3 (three) times daily. 12/05/19   Westley Chandler, MD  metFORMIN (GLUCOPHAGE) 500 MG tablet Take 500 mg by mouth daily. 11/16/19   [provider]  metFORMIN (GLUCOPHAGE) 500 MG tablet Take 1 tablet by mouth daily. 08/01/20   [provider]  naproxen sodium (ALEVE) 220 MG tablet Take 220 mg by mouth as needed (pain).    [provider]  pravastatin (PRAVACHOL) 40 MG tablet Take 40 mg by mouth at bedtime. 11/16/19   [provider]  sildenafil (REVATIO) 20 MG tablet Take 2-5 tablets as needed 08/01/20   [provider]      Allergies    Chocolate and Cocoa    Review of Systems   Review of Systems  All other systems reviewed and are negative.  Physical Exam Updated Vital Signs BP 114/72 (BP  Location: Right Arm)   Pulse 66   Temp 98.2 F (36.8 C) (Oral)   Resp 16   SpO2 97%  Physical Exam Vitals and nursing note reviewed.  Constitutional:      Appearance: He is well-developed.  HENT:     Head: Normocephalic.  Pulmonary:     Effort: Pulmonary effort is normal.  Abdominal:     General: There is no distension.  Musculoskeletal:        General: Normal range of motion.     Cervical back: Normal range of motion.     Comments: Nontender left hip and left leg patient has full range of motion he is able to squat without difficulty neurovascular neurosensory are intact  Skin:    General: Skin is warm.  Neurological:     Mental Status: He is alert and oriented to person, place, and time.    ED Results / Procedures / Treatments   Labs (all labs ordered are listed, but only abnormal results are displayed) Labs Reviewed - No data to display  EKG None  Radiology No results found.  Procedures Procedures    Medications Ordered in ED Medications - No data to display  ED Course/ Medical Decision Making/ A&P  Medical Decision Making  MDM patient declined x-ray he feels like he has a muscle strain I counseled patient he will take ibuprofen at home I will give him a phone number for orthopedist to follow-up with if he has any problems        Final Clinical Impression(s) / ED Diagnoses Final diagnoses:  Muscle strain of left lower leg, initial encounter    Rx / DC Orders ED Discharge Orders     None      An After Visit Summary was printed and given to the patient.    Osie Cheeks 04/23/22 1618    Gerhard Munch, MD 04/26/22 2300

## 2022-04-23 NOTE — Discharge Instructions (Signed)
Try taking ibuprofen for your discomfort.  Continue gentle exercises.  Follow-up with the orthopedist if pain persist

## 2022-04-23 NOTE — ED Triage Notes (Signed)
Patient complains of left leg pain that started around his hip and radiates down, pain started approximately one and half weeks ago. Patient denies any new urinary or stool incontinence. Pain is improved by walking up and down stairs.

## 2022-05-09 ENCOUNTER — Encounter (HOSPITAL_COMMUNITY): Payer: Self-pay

## 2022-05-09 ENCOUNTER — Emergency Department (HOSPITAL_COMMUNITY)
Admission: EM | Admit: 2022-05-09 | Discharge: 2022-05-09 | Disposition: A | Discharge status: Home / Self Care | Payer: Medicare Other | Attending: Emergency Medicine | Admitting: Emergency Medicine

## 2022-05-09 DIAGNOSIS — M25512 Pain in left shoulder: Secondary | ICD-10-CM | POA: Diagnosis present

## 2022-05-09 DIAGNOSIS — E119 Type 2 diabetes mellitus without complications: Secondary | ICD-10-CM | POA: Diagnosis not present

## 2022-05-09 DIAGNOSIS — M25511 Pain in right shoulder: Secondary | ICD-10-CM | POA: Insufficient documentation

## 2022-05-09 DIAGNOSIS — Z7984 Long term (current) use of oral hypoglycemic drugs: Secondary | ICD-10-CM | POA: Diagnosis not present

## 2022-05-09 MED ORDER — ACETAMINOPHEN 500 MG PO TABS: 1000.0000 mg | ORAL_TABLET | ORAL | Status: DC

## 2022-05-09 NOTE — ED Provider Notes (Signed)
[facility] EMERGENCY DEPARTMENT Provider Note   CSN: [id number] Arrival date & time: 05/09/22  2020     History  Chief Complaint  Patient presents with   Arm Pain    Paul Key is a 46 y.o. male.   Arm Pain  Patient is a 46 year old male to emergency room today.  He has a past medical history significant for DM 2 and HLD.  No other medical problems per patient.  He states that he thinks he may have slept in a strange position last night.  He states that both shoulders hurt some.  He states it is not severe.  He states that it is an achy pain worse with movement of the shoulders but seems to be gradually improving.  Has not taken any medications for his symptoms.  No aggravating factors apart from worse with movement.  Denies any chest pain or difficulty breathing no numbness or weakness no slurred speech or confusion.     Home Medications Prior to Admission medications   Medication Sig Start Date End Date Taking? Authorizing Provider  acetaminophen (TYLENOL) 500 MG tablet Take 1,000 mg by mouth every 6 (six) hours as needed for mild pain.    [provider]  cephALEXin (KEFLEX) 500 MG capsule Take 1 capsule (500 mg total) by mouth 3 (three) times daily. 01/06/20   Cliffton Asters, MD  Flaxseed, Linseed, (FLAX SEED OIL) 1000 MG CAPS Take 1,000 mg by mouth daily.    [provider]  lactobacillus acidophilus (BACID) TABS tablet Take 2 tablets by mouth 3 (three) times daily. 12/05/19   Westley Chandler, MD  metFORMIN (GLUCOPHAGE) 500 MG tablet Take 500 mg by mouth daily. 11/16/19   [provider]  metFORMIN (GLUCOPHAGE) 500 MG tablet Take 1 tablet by mouth daily. 08/01/20   [provider]  naproxen sodium (ALEVE) 220 MG tablet Take 220 mg by mouth as needed (pain).    [provider]  pravastatin (PRAVACHOL) 40 MG tablet Take 40 mg by mouth at bedtime. 11/16/19   [provider]  sildenafil (REVATIO) 20 MG  tablet Take 2-5 tablets as needed 08/01/20   [provider]      Allergies    Chocolate and Cocoa    Review of Systems   Review of Systems  Physical Exam Updated Vital Signs BP (!) 144/84   Pulse 71   Temp 99.1 F (37.3 C) (Oral)   Resp 18   SpO2 100%  Physical Exam Vitals and nursing note reviewed.  Constitutional:      General: He is not in acute distress.    Appearance: Normal appearance. He is not ill-appearing.  HENT:     Head: Normocephalic and atraumatic.  Eyes:     General: No scleral icterus.       Right eye: No discharge.        Left eye: No discharge.     Conjunctiva/sclera: Conjunctivae normal.  Pulmonary:     Effort: Pulmonary effort is normal.     Breath sounds: No stridor.  Musculoskeletal:     Comments: Full range of motion of both shoulders.  No significant palpable tenderness.  Bilateral radial artery pulses 2+ and symmetric.  Good coordination of movement.  Skin:    General: Skin is warm and dry.  Neurological:     Mental Status: He is alert and oriented to person, place, and time. Mental status is at baseline.     ED Results / Procedures /  Treatments   Labs (all labs ordered are listed, but only abnormal results are displayed) Labs Reviewed - No data to display  EKG None  Radiology No results found.  Procedures Procedures    Medications Ordered in ED Medications  acetaminophen (TYLENOL) tablet 1,000 mg (has no administration in time range)    ED Course/ Medical Decision Making/ A&P                           Medical Decision Making Risk OTC drugs.   Patient is a 46 year old male to emergency room today.  He has a past medical history significant for DM 2 and HLD.  No other medical problems per patient.  He states that he thinks he may have slept in a strange position last night.  He states that both shoulders hurt some.  He states it is not severe.  He states that it is an achy pain worse with movement of the  shoulders but seems to be gradually improving.  Has not taken any medications for his symptoms.  No aggravating factors apart from worse with movement.  Denies any chest pain or difficulty breathing no numbness or weakness no slurred speech or confusion.   Physical exam is unremarkable.  Patient is distally neurovascularly intact.  Overall well-appearing.  Will discharge home with Tylenol ibuprofen recommendations and warm compresses and recommendations to follow-up with PCP.  I did offer patient x-rays however I informed him that I have a low suspicion that we will find any acute abnormalities given the modality of his symptom onset.  He is understanding of this  We will discharge home at this time  Strict return precautions provided to patient.  He agrees to plan.   Final Clinical Impression(s) / ED Diagnoses Final diagnoses:  Acute pain of both shoulders    Rx / DC Orders ED Discharge Orders     None         Gailen Shelter, [place] 05/09/22 2110    Bethann Berkshire, MD 05/11/22 [phone]

## 2022-05-09 NOTE — Discharge Instructions (Addendum)
Your examination today is most concerning for a muscular injury  Warm compresses to your shoulders, Tylenol and ibuprofen as discussed below and follow-up with your primary care provider.  Please use Tylenol or ibuprofen for pain.  You may use 600 mg ibuprofen every 6 hours or 1000 mg of Tylenol every 6 hours.  You may choose to alternate between the 2.  This would be most effective.  Not to exceed 4 g of Tylenol within 24 hours.  Not to exceed 3200 mg ibuprofen 24 hours.   1. Medications: alternate ibuprofen and tylenol for pain control, take all usual home medications as they are prescribed 2. Treatment: rest, ice, elevate and use an ACE wrap or other compressive therapy to decrease swelling. Also drink plenty of fluids and do plenty of gentle stretching and move the affected muscle through its normal range of motion to prevent stiffness. 3. Follow Up: If your symptoms do not improve please follow up with orthopedics/sports medicine or your PCP for discussion of your diagnoses and further evaluation after today's visit; if you do not have a primary care doctor use the resource guide provided to find one; Please return to the ER for worsening symptoms or other concerns.

## 2022-05-09 NOTE — ED Triage Notes (Signed)
Pt states that he thinks he may have slept on his arms last night and from the shoulders down both of his arms hurt, denies injury

## 2022-06-12 ENCOUNTER — Encounter: Payer: Self-pay | Admitting: Podiatry

## 2022-06-12 ENCOUNTER — Ambulatory Visit (INDEPENDENT_AMBULATORY_CARE_PROVIDER_SITE_OTHER): Payer: Medicare Other | Admitting: Podiatry

## 2022-06-12 DIAGNOSIS — M79674 Pain in right toe(s): Secondary | ICD-10-CM

## 2022-06-12 DIAGNOSIS — B351 Tinea unguium: Secondary | ICD-10-CM

## 2022-06-12 DIAGNOSIS — M79675 Pain in left toe(s): Secondary | ICD-10-CM | POA: Diagnosis not present

## 2022-06-12 DIAGNOSIS — Q828 Other specified congenital malformations of skin: Secondary | ICD-10-CM

## 2022-06-12 DIAGNOSIS — E119 Type 2 diabetes mellitus without complications: Secondary | ICD-10-CM

## 2022-06-12 DIAGNOSIS — M216X1 Other acquired deformities of right foot: Secondary | ICD-10-CM

## 2022-06-12 NOTE — Progress Notes (Signed)
This patient returns to my office for at risk foot care.  This patient requires this care by a professional since this patient will be at risk due to having diabetes.  This patient is unable to cut nails himself since the patient cannot reach his nails.These nails are painful walking and wearing shoes.  He also has painful callus on the bottom of his right foot. This patient presents for at risk foot care today.  General Appearance  Alert, conversant and in no acute stress.  Vascular  Dorsalis pedis and posterior tibial  pulses are palpable  bilaterally.  Capillary return is within normal limits  bilaterally. Temperature is within normal limits  bilaterally.  Neurologic  Senn-Weinstein monofilament wire test within normal limits  bilaterally. Muscle power within normal limits bilaterally.  Nails Thick disfigured discolored nails with subungual debris  from hallux to fifth toes bilaterally. No evidence of bacterial infection or drainage bilaterally.  Orthopedic  No limitations of motion  feet .  No crepitus or effusions noted.  No bony pathology or digital deformities noted.  Skin  normotropic skin with no porokeratosis noted bilaterally.  No signs of infections or ulcers noted.   Callus/porokeratosis sub 5th metatarsal right foot.  Onychomycosis  Pain in right toes  Pain in left toes  Callus/porokeratosis right foot.  Consent was obtained for treatment procedures.   Mechanical debridement of nails 1-5  bilaterally performed with a nail nipper.  Filed with dremel without incident. Debrided porokeratosis with # 15 blade,  Discussed possible surgery in future.    Return office visit  3 months                    Told patient to return for periodic foot care and evaluation due to potential at risk complications.   Helane Gunther DPM

## 2022-09-11 ENCOUNTER — Encounter: Payer: Self-pay | Admitting: Podiatry

## 2022-09-11 ENCOUNTER — Ambulatory Visit (INDEPENDENT_AMBULATORY_CARE_PROVIDER_SITE_OTHER): Payer: Medicare Other | Admitting: Podiatry

## 2022-09-11 DIAGNOSIS — B351 Tinea unguium: Secondary | ICD-10-CM

## 2022-09-11 DIAGNOSIS — M216X1 Other acquired deformities of right foot: Secondary | ICD-10-CM

## 2022-09-11 DIAGNOSIS — M79674 Pain in right toe(s): Secondary | ICD-10-CM

## 2022-09-11 DIAGNOSIS — Q828 Other specified congenital malformations of skin: Secondary | ICD-10-CM | POA: Diagnosis not present

## 2022-09-11 DIAGNOSIS — E119 Type 2 diabetes mellitus without complications: Secondary | ICD-10-CM

## 2022-09-11 DIAGNOSIS — M79675 Pain in left toe(s): Secondary | ICD-10-CM

## 2022-09-11 NOTE — Progress Notes (Signed)
This patient returns to my office for at risk foot care.  This patient requires this care by a professional since this patient will be at risk due to having diabetes.  This patient is unable to cut nails himself since the patient cannot reach his nails.These nails are painful walking and wearing shoes.  He also has painful callus on the bottom of his right foot. This patient presents for at risk foot care today.  General Appearance  Alert, conversant and in no acute stress.  Vascular  Dorsalis pedis and posterior tibial  pulses are palpable  bilaterally.  Capillary return is within normal limits  bilaterally. Temperature is within normal limits  bilaterally.  Neurologic  Senn-Weinstein monofilament wire test within normal limits  bilaterally. Muscle power within normal limits bilaterally.  Nails Thick disfigured discolored nails with subungual debris  from hallux to fifth toes bilaterally. No evidence of bacterial infection or drainage bilaterally.  Orthopedic  No limitations of motion  feet .  No crepitus or effusions noted.  No bony pathology or digital deformities noted.  Skin  normotropic skin with no porokeratosis noted bilaterally.  No signs of infections or ulcers noted.   Callus/porokeratosis sub 5th metatarsal right foot.  Onychomycosis  Pain in right toes  Pain in left toes  Callus/porokeratosis right foot.  Consent was obtained for treatment procedures.   Mechanical debridement of nails 1-5  bilaterally performed with a nail nipper.  Filed with dremel without incident. Debrided porokeratosis with # 15 blade,    Return office visit  3 months                    Told patient to return for periodic foot care and evaluation due to potential at risk complications.   Lachandra Dettmann DPM   

## 2022-12-11 ENCOUNTER — Ambulatory Visit (INDEPENDENT_AMBULATORY_CARE_PROVIDER_SITE_OTHER): Payer: 59 | Admitting: Podiatry

## 2022-12-11 DIAGNOSIS — M79675 Pain in left toe(s): Secondary | ICD-10-CM

## 2022-12-11 DIAGNOSIS — B351 Tinea unguium: Secondary | ICD-10-CM | POA: Diagnosis not present

## 2022-12-11 DIAGNOSIS — M79674 Pain in right toe(s): Secondary | ICD-10-CM | POA: Diagnosis not present

## 2022-12-11 DIAGNOSIS — Q828 Other specified congenital malformations of skin: Secondary | ICD-10-CM

## 2022-12-11 DIAGNOSIS — E119 Type 2 diabetes mellitus without complications: Secondary | ICD-10-CM

## 2022-12-11 DIAGNOSIS — M216X1 Other acquired deformities of right foot: Secondary | ICD-10-CM

## 2022-12-11 NOTE — Progress Notes (Signed)
This patient returns to my office for at risk foot care.  This patient requires this care by a professional since this patient will be at risk due to having diabetes.  This patient is unable to cut nails himself since the patient cannot reach his nails.These nails are painful walking and wearing shoes.  He also has painful callus on the bottom of his right foot. This patient presents for at risk foot care today.  General Appearance  Alert, conversant and in no acute stress.  Vascular  Dorsalis pedis and posterior tibial  pulses are palpable  bilaterally.  Capillary return is within normal limits  bilaterally. Temperature is within normal limits  bilaterally.  Neurologic  Senn-Weinstein monofilament wire test within normal limits  bilaterally. Muscle power within normal limits bilaterally.  Nails Thick disfigured discolored nails with subungual debris  from hallux to fifth toes bilaterally. No evidence of bacterial infection or drainage bilaterally.  Orthopedic  No limitations of motion  feet .  No crepitus or effusions noted.  No bony pathology or digital deformities noted.  Skin  normotropic skin with no porokeratosis noted bilaterally.  No signs of infections or ulcers noted.   Callus/porokeratosis sub 5th metatarsal right foot.  Onychomycosis  Pain in right toes  Pain in left toes  Callus/porokeratosis right foot.  Consent was obtained for treatment procedures.   Mechanical debridement of nails 1-5  bilaterally performed with a nail nipper.  Filed with dremel without incident. Debrided porokeratosis with # 15 blade,    Return office visit  3 months                    Told patient to return for periodic foot care and evaluation due to potential at risk complications.   Gardiner Barefoot DPM

## 2023-03-12 ENCOUNTER — Ambulatory Visit: Payer: 59 | Admitting: Podiatry

## 2023-03-21 ENCOUNTER — Ambulatory Visit: Payer: 59 | Admitting: Podiatry

## 2023-04-16 ENCOUNTER — Ambulatory Visit: Payer: 59 | Admitting: Podiatry

## 2023-04-30 ENCOUNTER — Encounter: Payer: Self-pay | Admitting: Podiatry

## 2023-04-30 ENCOUNTER — Ambulatory Visit (INDEPENDENT_AMBULATORY_CARE_PROVIDER_SITE_OTHER): Payer: 59 | Admitting: Podiatry

## 2023-04-30 DIAGNOSIS — M79674 Pain in right toe(s): Secondary | ICD-10-CM

## 2023-04-30 DIAGNOSIS — E119 Type 2 diabetes mellitus without complications: Secondary | ICD-10-CM | POA: Diagnosis not present

## 2023-04-30 DIAGNOSIS — B351 Tinea unguium: Secondary | ICD-10-CM | POA: Diagnosis not present

## 2023-04-30 DIAGNOSIS — Q828 Other specified congenital malformations of skin: Secondary | ICD-10-CM

## 2023-04-30 DIAGNOSIS — M79675 Pain in left toe(s): Secondary | ICD-10-CM

## 2023-04-30 NOTE — Progress Notes (Addendum)
This patient returns to my office for at risk foot care.  This patient requires this care by a professional since this patient will be at risk due to having diabetes.  This patient is unable to cut nails himself since the patient cannot reach his nails.These nails are painful walking and wearing shoes.  He also has painful callus on the bottom of his right foot. This patient presents for at risk foot care today.  General Appearance  Alert, conversant and in no acute stress.  Vascular  Dorsalis pedis and posterior tibial  pulses are palpable  bilaterally.  Capillary return is within normal limits  bilaterally. Temperature is within normal limits  bilaterally.  Neurologic  Senn-Weinstein monofilament wire test within normal limits  bilaterally. Muscle power within normal limits bilaterally.  Nails Thick disfigured discolored nails with subungual debris  from hallux to fifth toes bilaterally. No evidence of bacterial infection or drainage bilaterally.  Orthopedic  No limitations of motion  feet .  No crepitus or effusions noted.  No bony pathology or digital deformities noted.  Skin  normotropic skin with no porokeratosis noted bilaterally.  No signs of infections or ulcers noted.   Callus/porokeratosis sub 5th metatarsal right foot.  Onychomycosis  Pain in right toes  Pain in left toes  Callus/porokeratosis right foot.  Consent was obtained for treatment procedures.   Mechanical debridement of nails 1-5  bilaterally performed with a nail nipper.  Filed with dremel without incident. Debrided porokeratosis with # 15 blade as a courtesy.    Return office visit  4  months                    Told patient to return for periodic foot care and evaluation due to potential at risk complications.   Helane Gunther DPM

## 2023-08-01 ENCOUNTER — Ambulatory Visit (INDEPENDENT_AMBULATORY_CARE_PROVIDER_SITE_OTHER): Payer: 59 | Admitting: Podiatry

## 2023-08-01 ENCOUNTER — Ambulatory Visit: Payer: 59 | Admitting: Podiatry

## 2023-08-01 ENCOUNTER — Encounter: Payer: Self-pay | Admitting: Podiatry

## 2023-08-01 DIAGNOSIS — Q828 Other specified congenital malformations of skin: Secondary | ICD-10-CM

## 2023-08-01 DIAGNOSIS — M216X1 Other acquired deformities of right foot: Secondary | ICD-10-CM

## 2023-08-01 DIAGNOSIS — E119 Type 2 diabetes mellitus without complications: Secondary | ICD-10-CM

## 2023-08-01 DIAGNOSIS — M79674 Pain in right toe(s): Secondary | ICD-10-CM

## 2023-08-01 DIAGNOSIS — M79675 Pain in left toe(s): Secondary | ICD-10-CM

## 2023-08-01 DIAGNOSIS — B351 Tinea unguium: Secondary | ICD-10-CM

## 2023-08-01 NOTE — Progress Notes (Signed)
This patient returns to my office for at risk foot care.  This patient requires this care by a professional since this patient will be at risk due to having diabetes.  This patient is unable to cut nails himself since the patient cannot reach his nails.These nails are painful walking and wearing shoes.  He also has painful callus on the bottom of his right foot. This patient presents for at risk foot care today.  General Appearance  Alert, conversant and in no acute stress.  Vascular  Dorsalis pedis and posterior tibial  pulses are palpable  bilaterally.  Capillary return is within normal limits  bilaterally. Temperature is within normal limits  bilaterally.  Neurologic  Senn-Weinstein monofilament wire test within normal limits  bilaterally. Muscle power within normal limits bilaterally.  Nails Thick disfigured discolored nails with subungual debris  from hallux to fifth toes bilaterally. No evidence of bacterial infection or drainage bilaterally.  Orthopedic  No limitations of motion  feet .  No crepitus or effusions noted.  No bony pathology or digital deformities noted.  Skin  normotropic skin with no porokeratosis noted bilaterally.  No signs of infections or ulcers noted.   Callus/porokeratosis sub 5th metatarsal right foot.  Onychomycosis  Pain in right toes  Pain in left toes  Callus/porokeratosis right foot.  Consent was obtained for treatment procedures.   Mechanical debridement of nails 1-5  bilaterally performed with a nail nipper.  Filed with dremel without incident. Debrided porokeratosis with # 15 blade as a courtesy.  Told patient to make a surgical consullt with Dr.  Allena Katz.   Return office visit  10 weeks                    Told patient to return for periodic foot care and evaluation due to potential at risk complications.   Helane Gunther DPM

## 2023-08-21 ENCOUNTER — Ambulatory Visit: Payer: 59 | Admitting: Podiatry

## 2023-10-07 ENCOUNTER — Ambulatory Visit (INDEPENDENT_AMBULATORY_CARE_PROVIDER_SITE_OTHER): Payer: 59 | Admitting: Podiatry

## 2023-10-07 ENCOUNTER — Encounter: Payer: Self-pay | Admitting: Podiatry

## 2023-10-07 DIAGNOSIS — E119 Type 2 diabetes mellitus without complications: Secondary | ICD-10-CM | POA: Diagnosis not present

## 2023-10-07 DIAGNOSIS — B351 Tinea unguium: Secondary | ICD-10-CM | POA: Diagnosis not present

## 2023-10-07 DIAGNOSIS — M79674 Pain in right toe(s): Secondary | ICD-10-CM

## 2023-10-07 DIAGNOSIS — M79675 Pain in left toe(s): Secondary | ICD-10-CM

## 2023-10-07 DIAGNOSIS — M216X1 Other acquired deformities of right foot: Secondary | ICD-10-CM

## 2023-10-07 DIAGNOSIS — Q828 Other specified congenital malformations of skin: Secondary | ICD-10-CM | POA: Diagnosis not present

## 2023-10-07 NOTE — Progress Notes (Signed)
This patient returns to my office for at risk foot care.  This patient requires this care by a professional since this patient will be at risk due to having diabetes.  This patient is unable to cut nails himself since the patient cannot reach his nails.These nails are painful walking and wearing shoes.  He also has painful callus on the bottom of his right foot. This patient presents for at risk foot care today.  General Appearance  Alert, conversant and in no acute stress.  Vascular  Dorsalis pedis and posterior tibial  pulses are palpable  bilaterally.  Capillary return is within normal limits  bilaterally. Temperature is within normal limits  bilaterally.  Neurologic  Senn-Weinstein monofilament wire test within normal limits  bilaterally. Muscle power within normal limits bilaterally.  Nails Thick disfigured discolored nails with subungual debris  from hallux to fifth toes bilaterally. No evidence of bacterial infection or drainage bilaterally.  Orthopedic  No limitations of motion  feet .  No crepitus or effusions noted.  No bony pathology or digital deformities noted.  Skin  normotropic skin with no porokeratosis noted bilaterally.  No signs of infections or ulcers noted.   Callus/porokeratosis sub 5th metatarsal right foot.  Onychomycosis  Pain in right toes  Pain in left toes  Callus/porokeratosis right foot.  Consent was obtained for treatment procedures.   Mechanical debridement of nails 1-5  bilaterally performed with a nail nipper.  Filed with dremel without incident. Debrided porokeratosis with # 15 blade as a courtesy.     Return office visit  10 weeks                    Told patient to return for periodic foot care and evaluation due to potential at risk complications.   Helane Gunther DPM

## 2023-10-28 ENCOUNTER — Ambulatory Visit: Payer: 59 | Admitting: Podiatry

## 2024-02-28 ENCOUNTER — Other Ambulatory Visit: Payer: Self-pay

## 2024-02-28 ENCOUNTER — Emergency Department (HOSPITAL_COMMUNITY)
Admission: EM | Admit: 2024-02-28 | Discharge: 2024-02-28 | Discharge status: Against Medical Advice | Attending: Emergency Medicine | Admitting: Emergency Medicine

## 2024-02-28 ENCOUNTER — Encounter (HOSPITAL_COMMUNITY): Payer: Self-pay

## 2024-02-28 DIAGNOSIS — E119 Type 2 diabetes mellitus without complications: Secondary | ICD-10-CM | POA: Insufficient documentation

## 2024-02-28 DIAGNOSIS — R202 Paresthesia of skin: Secondary | ICD-10-CM | POA: Insufficient documentation

## 2024-02-28 DIAGNOSIS — Z5321 Procedure and treatment not carried out due to patient leaving prior to being seen by health care provider: Secondary | ICD-10-CM | POA: Insufficient documentation

## 2024-02-28 LAB — BASIC METABOLIC PANEL WITH GFR
Anion gap: 13 (ref 5–15)
BUN: 13 mg/dL (ref 6–20)
CO2: 26 mmol/L (ref 22–32)
Calcium: 9.2 mg/dL (ref 8.9–10.3)
Chloride: 97 mmol/L — ABNORMAL LOW (ref 98–111)
Creatinine, Ser: 1.04 mg/dL (ref 0.61–1.24)
GFR, Estimated: >60 mL/min (ref >60)
Glucose, Bld: 122 mg/dL — ABNORMAL HIGH (ref 70–99)
Potassium: 3.9 mmol/L (ref 3.5–5.1)
Sodium: 136 mmol/L (ref 135–145)

## 2024-02-28 LAB — CBC
HCT: 41.4 % (ref 39.0–52.0)
Hemoglobin: 14.2 g/dL (ref 13.0–17.0)
MCH: 27.6 pg (ref 26.0–34.0)
MCHC: 34.3 g/dL (ref 30.0–36.0)
MCV: 80.5 fL (ref 80.0–100.0)
Platelets: 281 10*3/uL (ref 150–400)
RBC: 5.14 MIL/uL (ref 4.22–5.81)
RDW: 15.9 % — ABNORMAL HIGH (ref 11.5–15.5)
WBC: 5.2 10*3/uL (ref 4.0–10.5)
nRBC: 0 % (ref 0.0–0.2)

## 2024-02-28 LAB — CBG MONITORING, ED: Glucose-Capillary: 125 mg/dL — ABNORMAL HIGH (ref 70–99)

## 2024-02-28 NOTE — ED Notes (Signed)
 Patient said they feel better and was leaving. Patient seen leaving ED

## 2024-02-28 NOTE — ED Triage Notes (Addendum)
 Pt c/o bilateral feet tingling since yesterday; hx DM, endorses compliance with meds; denies pain; denies fevers, denies N/V; pt ambulatory with steady gait  Verbal consent given for MSE

## 2024-09-30 ENCOUNTER — Ambulatory Visit: Admitting: Podiatry

## 2024-11-15 ENCOUNTER — Other Ambulatory Visit: Payer: Self-pay

## 2024-11-15 ENCOUNTER — Encounter (HOSPITAL_COMMUNITY): Payer: Self-pay

## 2024-11-15 ENCOUNTER — Emergency Department (HOSPITAL_COMMUNITY): Admission: EM | Admit: 2024-11-15 | Discharge: 2024-11-15 | Disposition: A | Discharge status: Home / Self Care

## 2024-11-15 ENCOUNTER — Emergency Department (HOSPITAL_COMMUNITY)

## 2024-11-15 DIAGNOSIS — E119 Type 2 diabetes mellitus without complications: Secondary | ICD-10-CM | POA: Insufficient documentation

## 2024-11-15 DIAGNOSIS — M25512 Pain in left shoulder: Secondary | ICD-10-CM | POA: Diagnosis present

## 2024-11-15 DIAGNOSIS — Z7984 Long term (current) use of oral hypoglycemic drugs: Secondary | ICD-10-CM | POA: Diagnosis not present

## 2024-11-15 MED ORDER — LIDOCAINE 5 % EX PTCH: 1.0000 | MEDICATED_PATCH | CUTANEOUS | 0 refills | Status: AC

## 2024-11-15 MED ORDER — ACETAMINOPHEN 325 MG PO TABS
650.0000 mg | ORAL_TABLET | Freq: Once | ORAL | Status: AC
  Administered 2024-11-15: 650 mg via ORAL
  Filled 2024-11-15: qty 2

## 2024-11-15 NOTE — ED Provider Notes (Signed)
 " [place] EMERGENCY DEPARTMENT AT [facility] Provider Note   CSN: [id number] Arrival date & time: 11/15/24  1421     Patient presents with: Shoulder Pain   Paul Key is a 48 y.o. male.   49 year old male presenting with left shoulder pain. Ongoing for several weeks after moving heavy furniture, no reports of fall/specific injury.  Has not tried any medications at home for relief of pain.  Pain is exacerbated by any movement at the shoulder, denies swelling.  No prior shoulder injury.   Shoulder Pain      Prior to Admission medications  Medication Sig Start Date End Date Taking? Authorizing Provider  acetaminophen  (TYLENOL ) 500 MG tablet Take 1,000 mg by mouth every 6 (six) hours as needed for mild pain.    [provider]  cephALEXin  (KEFLEX ) 500 MG capsule Take 1 capsule (500 mg total) by mouth 3 (three) times daily. 01/06/20   Elaine Rush, MD  Flaxseed, Linseed, (FLAX SEED OIL) 1000 MG CAPS Take 1,000 mg by mouth daily.    [provider]  lactobacillus acidophilus (BACID) TABS tablet Take 2 tablets by mouth 3 (three) times daily. 12/05/19   Delores Suzann HERO, MD  metFORMIN  (GLUCOPHAGE ) 500 MG tablet Take 500 mg by mouth daily. 11/16/19   [provider]  metFORMIN  (GLUCOPHAGE ) 500 MG tablet Take 1 tablet by mouth daily. 08/01/20   [provider]  naproxen  sodium (ALEVE ) 220 MG tablet Take 220 mg by mouth as needed (pain).    [provider]  pravastatin  (PRAVACHOL ) 40 MG tablet Take 40 mg by mouth at bedtime. 11/16/19   [provider]  sildenafil (REVATIO) 20 MG tablet Take 2-5 tablets as needed 08/01/20   [provider]    Allergies: Chocolate and Cocoa    Review of Systems  Updated Vital Signs BP 106/75 (BP Location: Right Arm)   Pulse 70   Temp 98 F (36.7 C)   Resp 15   SpO2 97%   Physical Exam Vitals and nursing note reviewed.  HENT:     Head: Normocephalic.  Eyes:     Extraocular  Movements: Extraocular movements intact.  Cardiovascular:     Rate and Rhythm: Normal rate.  Pulmonary:     Effort: Pulmonary effort is normal.  Musculoskeletal:     Cervical back: Normal range of motion.     Comments: L shoulder: No appreciable swelling or bony deformity, no erythema/warmth.  Full passive range of motion, active range of motion is limited secondary to pain, specifically with overhead reach and abduction  Skin:    General: Skin is warm and dry.  Neurological:     Mental Status: He is alert and oriented to person, place, and time.     (all labs ordered are listed, but only abnormal results are displayed) Labs Reviewed - No data to display  EKG: None  Radiology: DG Shoulder Left Result Date: 11/15/2024 CLINICAL DATA:  Left shoulder pain after moving furniture. EXAM: LEFT SHOULDER - 2+ VIEW COMPARISON:  None Available. FINDINGS: There is no evidence of fracture or dislocation. Mild acromioclavicular degenerative spurring. No evidence of focal bone lesion or erosive change. Possible os acromial. Soft tissues are unremarkable. IMPRESSION: No acute findings.  Mild acromioclavicular degenerative change. Electronically Signed   By: Andrea Gasman M.D.   On: 11/15/2024 17:02     Procedures   Medications Ordered in the ED  acetaminophen  (TYLENOL ) tablet 650 mg (650 mg Oral Given 11/15/24 1518)  Medical Decision Making This patient presents to the ED for concern of shoulder pain, this involves an extensive number of treatment options, and is a complaint that carries with it a high risk of complications and morbidity.  The differential diagnosis includes fracture, dislocation, osteoarthritis, muscle strain/sprain   Co morbidities that complicate the patient evaluation  Diabetes   Additional history obtained:  Additional history obtained from record review External records from outside source obtained and reviewed including  prior ED note   Imaging Studies ordered:  I ordered imaging studies including shoulder XR  I independently visualized and interpreted imaging which showed No acute findings.  Mild acromioclavicular degenerative change. I agree with the radiologist interpretation   Cardiac Monitoring: / EKG:  The patient was maintained on a cardiac monitor.  I personally viewed and interpreted the cardiac monitored which showed an underlying rhythm of: NSR   Problem List / ED Course / Critical interventions / Medication management  I ordered medication including Tylenol  and lidocaine  patch for pain Reevaluation of the patient after these medicines showed that the patient improved I have reviewed the patients home medicines and have made adjustments as needed   Test / Admission - Considered:  Physical exam is notable as above, full passive range of motion, no erythema/swelling/redness, patient is well-appearing and in no acute distress, no suspicion for septic arthritis at this time.  X-ray imaging is notable as above, no acute fracture/dislocation.  I suspect that patient's symptoms may be secondary to a muscle sprain/strain or a rotator cuff injury.  I recommend that he continue Tylenol /ibuprofen  and lidocaine  patches as needed for pain, I will provide him with contact information for an orthopedic specialist to schedule follow-up.  Patient voiced understanding and is in agreement with this plan, return precautions discussed, he is appropriate for discharge at this time.    Amount and/or Complexity of Data Reviewed Radiology: ordered.  Risk OTC drugs. Prescription drug management.        Final diagnoses:  Left shoulder pain, unspecified chronicity    ED Discharge Orders          Ordered    lidocaine  (LIDODERM ) 5 %  Every 24 hours        11/15/24 1933               Glendia Rocky SAILOR, [place] 11/15/24 1938  "

## 2024-11-15 NOTE — Discharge Instructions (Signed)
 The x-ray of your shoulder today does not show any evidence of fracture or dislocation, but you do have some degenerative changes of your acromioclavicular joint.  Please continue Tylenol /ibuprofen  as needed for pain, I have also prescribed you lidocaine  patches to use as needed for pain as well.  Please follow-up with the orthopedic specialist at the number provided above if your symptoms persist.  Please return to the emergency department if your symptoms worsen.

## 2024-11-15 NOTE — ED Provider Triage Note (Signed)
 Emergency Medicine Provider Triage Evaluation Note  Paul Key , a 48 y.o. male  was evaluated in triage.  Pt complains of left shoulder pain.  Ongoing for several weeks after moving furniture.  Has not tried any medications at home for relief of pain.  Pain is exacerbated by any movement at the shoulder.  Review of Systems  Positive: As above Negative: As above  Physical Exam  BP 121/75   Pulse 85   Temp 98.4 F (36.9 C)   Resp 14   SpO2 99%  Gen:   Awake, no distress   Resp:  Normal effort  MSK:   Moves extremities without difficulty  Other:  Limited active range of motion at the left shoulder secondary to pain  Medical Decision Making  Medically screening exam initiated at 3:05 PM.  Appropriate orders placed.  Paul Key was informed that the remainder of the evaluation will be completed by another provider, this initial triage assessment does not replace that evaluation, and the importance of remaining in the ED until their evaluation is complete.     Glendia Rocky SAILOR, [place] 11/15/24 1505

## 2024-11-15 NOTE — ED Triage Notes (Signed)
 Pt c.o left shoulder pain x few weeks since helping his mom move furniture.
